# Patient Record
Sex: Male | Born: 1945 | Race: White | Hispanic: No | Marital: Married | Smoking: Former smoker
Health system: Southern US, Community
[De-identification: ages and names within clinical notes are randomized; demographics above are authoritative.]

## PROBLEM LIST (undated history)

## (undated) DIAGNOSIS — E785 Hyperlipidemia, unspecified: Secondary | ICD-10-CM

## (undated) DIAGNOSIS — H269 Unspecified cataract: Secondary | ICD-10-CM

## (undated) DIAGNOSIS — F1021 Alcohol dependence, in remission: Secondary | ICD-10-CM

## (undated) DIAGNOSIS — F419 Anxiety disorder, unspecified: Secondary | ICD-10-CM

## (undated) DIAGNOSIS — M1990 Unspecified osteoarthritis, unspecified site: Secondary | ICD-10-CM

## (undated) DIAGNOSIS — N400 Enlarged prostate without lower urinary tract symptoms: Secondary | ICD-10-CM

## (undated) DIAGNOSIS — M109 Gout, unspecified: Secondary | ICD-10-CM

## (undated) DIAGNOSIS — I10 Essential (primary) hypertension: Secondary | ICD-10-CM

## (undated) HISTORY — DX: Gout, unspecified: M10.9

## (undated) HISTORY — DX: Essential (primary) hypertension: I10

## (undated) HISTORY — DX: Alcohol dependence, in remission: F10.21

## (undated) HISTORY — PX: SHOULDER SURGERY: SHX246

## (undated) HISTORY — DX: Benign prostatic hyperplasia without lower urinary tract symptoms: N40.0

## (undated) HISTORY — DX: Unspecified osteoarthritis, unspecified site: M19.90

## (undated) HISTORY — PX: HERNIA REPAIR: SHX51

## (undated) HISTORY — PX: ELBOW SURGERY: SHX618

## (undated) HISTORY — DX: Anxiety disorder, unspecified: F41.9

## (undated) HISTORY — DX: Unspecified cataract: H26.9

## (undated) HISTORY — PX: KNEE SURGERY: SHX244

## (undated) HISTORY — DX: Hyperlipidemia, unspecified: E78.5

---

## 1978-07-21 HISTORY — PX: VASECTOMY: SHX75

## 1998-06-02 ENCOUNTER — Emergency Department (HOSPITAL_COMMUNITY): Admission: EM | Admit: 1998-06-02 | Discharge: 1998-06-02 | Payer: Self-pay | Admitting: *Deleted

## 1998-06-02 ENCOUNTER — Encounter: Payer: Self-pay | Admitting: Emergency Medicine

## 1998-08-01 ENCOUNTER — Other Ambulatory Visit: Admission: RE | Admit: 1998-08-01 | Discharge: 1998-08-01 | Payer: Self-pay | Admitting: Gastroenterology

## 1998-08-24 ENCOUNTER — Ambulatory Visit (HOSPITAL_COMMUNITY): Admission: RE | Admit: 1998-08-24 | Discharge: 1998-08-24 | Payer: Self-pay | Admitting: Gastroenterology

## 2000-03-14 ENCOUNTER — Emergency Department (HOSPITAL_COMMUNITY): Admission: EM | Admit: 2000-03-14 | Discharge: 2000-03-14 | Payer: Self-pay | Admitting: Emergency Medicine

## 2001-08-20 ENCOUNTER — Ambulatory Visit (HOSPITAL_COMMUNITY): Admission: RE | Admit: 2001-08-20 | Discharge: 2001-08-20 | Payer: Self-pay | Admitting: Gastroenterology

## 2001-12-19 ENCOUNTER — Emergency Department (HOSPITAL_COMMUNITY): Admission: EM | Admit: 2001-12-19 | Discharge: 2001-12-19 | Payer: Self-pay

## 2001-12-19 ENCOUNTER — Encounter: Payer: Self-pay | Admitting: Emergency Medicine

## 2005-02-08 ENCOUNTER — Emergency Department (HOSPITAL_COMMUNITY): Admission: EM | Admit: 2005-02-08 | Discharge: 2005-02-08 | Payer: Self-pay | Admitting: Emergency Medicine

## 2005-02-23 ENCOUNTER — Emergency Department (HOSPITAL_COMMUNITY): Admission: EM | Admit: 2005-02-23 | Discharge: 2005-02-23 | Payer: Self-pay | Admitting: Emergency Medicine

## 2006-08-13 ENCOUNTER — Ambulatory Visit (HOSPITAL_BASED_OUTPATIENT_CLINIC_OR_DEPARTMENT_OTHER): Admission: RE | Admit: 2006-08-13 | Discharge: 2006-08-14 | Payer: Self-pay | Admitting: Orthopedic Surgery

## 2011-07-07 ENCOUNTER — Ambulatory Visit (INDEPENDENT_AMBULATORY_CARE_PROVIDER_SITE_OTHER): Payer: BC Managed Care – PPO | Admitting: Family Medicine

## 2011-07-07 DIAGNOSIS — R252 Cramp and spasm: Secondary | ICD-10-CM

## 2011-07-09 ENCOUNTER — Other Ambulatory Visit: Payer: Self-pay | Admitting: Family Medicine

## 2011-07-09 DIAGNOSIS — M5416 Radiculopathy, lumbar region: Secondary | ICD-10-CM

## 2011-07-18 ENCOUNTER — Other Ambulatory Visit: Payer: Self-pay

## 2011-07-22 HISTORY — PX: EYE SURGERY: SHX253

## 2011-07-24 ENCOUNTER — Ambulatory Visit (INDEPENDENT_AMBULATORY_CARE_PROVIDER_SITE_OTHER): Payer: BC Managed Care – PPO

## 2011-07-24 DIAGNOSIS — N390 Urinary tract infection, site not specified: Secondary | ICD-10-CM

## 2011-07-24 DIAGNOSIS — R509 Fever, unspecified: Secondary | ICD-10-CM

## 2011-07-24 DIAGNOSIS — D7289 Other specified disorders of white blood cells: Secondary | ICD-10-CM

## 2011-08-02 ENCOUNTER — Other Ambulatory Visit (INDEPENDENT_AMBULATORY_CARE_PROVIDER_SITE_OTHER): Payer: BC Managed Care – PPO

## 2011-08-02 DIAGNOSIS — N390 Urinary tract infection, site not specified: Secondary | ICD-10-CM

## 2011-11-09 ENCOUNTER — Ambulatory Visit (INDEPENDENT_AMBULATORY_CARE_PROVIDER_SITE_OTHER): Payer: BC Managed Care – PPO | Admitting: Family Medicine

## 2011-11-09 VITALS — BP 113/71 | HR 108 | Temp 98.0°F | Resp 18 | Ht 69.5 in | Wt 217.8 lb

## 2011-11-09 DIAGNOSIS — R300 Dysuria: Secondary | ICD-10-CM

## 2011-11-09 DIAGNOSIS — N400 Enlarged prostate without lower urinary tract symptoms: Secondary | ICD-10-CM

## 2011-11-09 LAB — POCT URINALYSIS DIPSTICK
Bilirubin, UA: NEGATIVE
Glucose, UA: NEGATIVE
Ketones, UA: NEGATIVE
Leukocytes, UA: NEGATIVE

## 2011-11-09 LAB — POCT CBC
Granulocyte percent: 76.8 %G (ref 37–80)
HCT, POC: 41.8 % — AB (ref 43.5–53.7)
Hemoglobin: 13.8 g/dL — AB (ref 14.1–18.1)
Lymph, poc: 2.5 (ref 0.6–3.4)
POC Granulocyte: 10.9 — AB (ref 2–6.9)

## 2011-11-09 LAB — POCT UA - MICROSCOPIC ONLY
Bacteria, U Microscopic: NEGATIVE
Mucus, UA: NEGATIVE
WBC, Ur, HPF, POC: NEGATIVE

## 2011-11-09 MED ORDER — LEVOFLOXACIN 500 MG PO TABS: 500.0000 mg | ORAL_TABLET | Freq: Every day | ORAL | Status: AC

## 2011-11-09 MED ORDER — DOXAZOSIN MESYLATE 1 MG PO TABS: 1.0000 mg | ORAL_TABLET | Freq: Every day | ORAL | Status: DC

## 2011-11-09 NOTE — Progress Notes (Signed)
  Subjective:    Patient ID: Clayton Lawson, male    DOB: 1946/01/03, 66 y.o.   MRN: [id number]  HPI Patient presents complaining of chills, tactile temperature, headache, dysuria and malaise. Urinary frequency; weak urine stream Was on at different times Cardura and Flomax No sick contacts(grandchildren now living with him)  Seasonal allergies  Recent cataract surgery(3/13, 4/3) Nonsmoker Daughter and her children have moved in with patient. Significant stressors.  Review of Systems     Objective:   Physical Exam  Constitutional: He appears well-developed.  Neck: Neck supple.  Cardiovascular: Normal rate, regular rhythm and normal heart sounds.   Pulmonary/Chest: Effort normal and breath sounds normal.  Abdominal: Soft.  Musculoskeletal: Normal range of motion.  Neurological: He is alert.  Skin: Skin is warm.          Results for orders placed in visit on 11/09/11  POCT CBC      Component Value Range   WBC 14.2 (*) 4.6 - 10.2 (K/uL)   Lymph, poc 2.5  0.6 - 3.4    POC LYMPH PERCENT 17.3  10 - 50 (%L)   MID (cbc) 0.8  0 - 0.9    POC MID % 5.9  0 - 12 (%M)   POC Granulocyte 10.9 (*) 2 - 6.9    Granulocyte percent 76.8  37 - 80 (%G)   RBC 4.75  4.69 - 6.13 (M/uL)   Hemoglobin 13.8 (*) 14.1 - 18.1 (g/dL)   HCT, POC 78.2 (*) 95.6 - 53.7 (%)   MCV 87.9  80 - 97 (fL)   MCH, POC 29.1  27 - 31.2 (pg)   MCHC 33.0  31.8 - 35.4 (g/dL)   RDW, POC 21.3     Platelet Count, POC 316  142 - 424 (K/uL)   MPV 7.9  0 - 99.8 (fL)  POCT URINALYSIS DIPSTICK      Component Value Range   Color, UA yellow     Clarity, UA clear     Glucose, UA neg     Bilirubin, UA neg     Ketones, UA neg     Spec Grav, UA 1.010     Blood, UA neg     pH, UA 5.5     Protein, UA neg     Urobilinogen, UA 0.2     Nitrite, UA neg     Leukocytes, UA Negative    POCT UA - MICROSCOPIC ONLY      Component Value Range   WBC, Ur, HPF, POC neg     RBC, urine, microscopic neg     Bacteria, U Microscopic  neg     Mucus, UA neg     Epithelial cells, urine per micros neg     Crystals, Ur, HPF, POC neg     Casts, Ur, LPF, POC neg     Yeast, UA neg      Assessment & Plan:   1. Dysuria; early prostatitis  POCT CBC, POCT urinalysis dipstick, POCT UA - Microscopic Only, levofloxacin (LEVAQUIN) 500 MG tablet  2. BPH (benign prostatic hyperplasia)  doxazosin (CARDURA) 1 MG tablet    1. Dysuria; early prostatitis  POCT CBC, POCT urinalysis dipstick, POCT UA - Microscopic Only, levofloxacin (LEVAQUIN) 500 MG tablet  2. BPH (benign prostatic hyperplasia)  doxazosin (CARDURA) 1 MG tablet

## 2011-12-15 ENCOUNTER — Other Ambulatory Visit: Payer: Self-pay | Admitting: Family Medicine

## 2012-01-30 ENCOUNTER — Other Ambulatory Visit: Payer: Self-pay | Admitting: Physician Assistant

## 2012-01-30 ENCOUNTER — Other Ambulatory Visit: Payer: Self-pay | Admitting: *Deleted

## 2012-01-30 MED ORDER — PRAVASTATIN SODIUM 40 MG PO TABS: 40.0000 mg | ORAL_TABLET | Freq: Every day | ORAL | Status: DC

## 2012-02-02 ENCOUNTER — Other Ambulatory Visit: Payer: Self-pay | Admitting: Family Medicine

## 2012-02-06 ENCOUNTER — Other Ambulatory Visit: Payer: Self-pay

## 2012-02-06 MED ORDER — COLCHICINE 0.6 MG PO TABS: 0.6000 mg | ORAL_TABLET | Freq: Every day | ORAL | Status: DC

## 2012-05-28 ENCOUNTER — Ambulatory Visit (INDEPENDENT_AMBULATORY_CARE_PROVIDER_SITE_OTHER): Payer: Medicare Other | Admitting: Family Medicine

## 2012-05-28 ENCOUNTER — Encounter: Payer: Self-pay | Admitting: Family Medicine

## 2012-05-28 VITALS — BP 120/80 | HR 58 | Temp 97.9°F | Resp 16 | Ht 69.0 in | Wt 217.6 lb

## 2012-05-28 DIAGNOSIS — M109 Gout, unspecified: Secondary | ICD-10-CM | POA: Insufficient documentation

## 2012-05-28 DIAGNOSIS — E669 Obesity, unspecified: Secondary | ICD-10-CM

## 2012-05-28 DIAGNOSIS — N138 Other obstructive and reflux uropathy: Secondary | ICD-10-CM

## 2012-05-28 DIAGNOSIS — Z125 Encounter for screening for malignant neoplasm of prostate: Secondary | ICD-10-CM

## 2012-05-28 DIAGNOSIS — Z23 Encounter for immunization: Secondary | ICD-10-CM

## 2012-05-28 DIAGNOSIS — I10 Essential (primary) hypertension: Secondary | ICD-10-CM | POA: Insufficient documentation

## 2012-05-28 DIAGNOSIS — N400 Enlarged prostate without lower urinary tract symptoms: Secondary | ICD-10-CM

## 2012-05-28 DIAGNOSIS — Z0000 Encounter for general adult medical examination without abnormal findings: Secondary | ICD-10-CM

## 2012-05-28 DIAGNOSIS — E785 Hyperlipidemia, unspecified: Secondary | ICD-10-CM | POA: Insufficient documentation

## 2012-05-28 LAB — POCT URINALYSIS DIPSTICK
Blood, UA: NEGATIVE
Leukocytes, UA: NEGATIVE
Nitrite, UA: NEGATIVE
Protein, UA: NEGATIVE
Urobilinogen, UA: 0.2
pH, UA: 7

## 2012-05-28 LAB — CBC WITH DIFFERENTIAL/PLATELET
Basophils Absolute: 0 10*3/uL (ref 0.0–0.1)
Basophils Relative: 1 % (ref 0–1)
Eosinophils Absolute: 0.4 10*3/uL (ref 0.0–0.7)
Eosinophils Relative: 5 % (ref 0–5)
MCH: 29.1 pg (ref 26.0–34.0)
MCV: 86.6 fL (ref 78.0–100.0)
Neutrophils Relative %: 62 % (ref 43–77)
Platelets: 293 10*3/uL (ref 150–400)
RBC: 4.85 MIL/uL (ref 4.22–5.81)
RDW: 13.9 % (ref 11.5–15.5)
WBC: 6.9 10*3/uL (ref 4.0–10.5)

## 2012-05-28 LAB — COMPREHENSIVE METABOLIC PANEL
ALT: 36 U/L (ref 0–53)
AST: 23 U/L (ref 0–37)
BUN: 17 mg/dL (ref 6–23)
Calcium: 9.3 mg/dL (ref 8.4–10.5)
Chloride: 104 mEq/L (ref 96–112)
Creat: 0.92 mg/dL (ref 0.50–1.35)
Total Bilirubin: 0.5 mg/dL (ref 0.3–1.2)

## 2012-05-28 LAB — LIPID PANEL
Cholesterol: 177 mg/dL (ref 0–200)
HDL: 36 mg/dL — ABNORMAL LOW (ref >39)
Total CHOL/HDL Ratio: 4.9 Ratio
VLDL: 29 mg/dL (ref 0–40)

## 2012-05-28 MED ORDER — TAMSULOSIN HCL 0.4 MG PO CAPS: 0.4000 mg | ORAL_CAPSULE | Freq: Every day | ORAL | Status: DC

## 2012-05-28 MED ORDER — ALLOPURINOL 300 MG PO TABS: 300.0000 mg | ORAL_TABLET | Freq: Every day | ORAL | Status: DC

## 2012-05-28 MED ORDER — DOXAZOSIN MESYLATE 1 MG PO TABS: 1.0000 mg | ORAL_TABLET | Freq: Every day | ORAL | Status: DC

## 2012-05-28 MED ORDER — PRAVASTATIN SODIUM 40 MG PO TABS: 40.0000 mg | ORAL_TABLET | Freq: Every day | ORAL | Status: DC

## 2012-05-28 MED ORDER — LISINOPRIL-HYDROCHLOROTHIAZIDE 20-25 MG PO TABS: 1.0000 | ORAL_TABLET | Freq: Every day | ORAL | Status: DC

## 2012-05-28 NOTE — Progress Notes (Signed)
Subjective:    Patient ID: Clayton Kin., male    DOB: [birth year], 66 y.o.   MRN: [id number]  HPI  This 66 y.o. Cauc male is here for annual CPE; chronic medications are taken for HTN, Gout  and occasional joint discomfort (not debilitating); he is s/p multiple joint surgeries.   He is married and works full time. Alcohol  consumption is daily; current  Nonsmoker.  Pt exercises most days of the week.   HCM: Colonoscopies at age 62 and 78.    Review of Systems  Constitutional: Negative.   HENT: Negative.   Eyes: Negative.   Respiratory: Negative.   Cardiovascular: Negative.   Gastrointestinal: Negative.   Genitourinary: Positive for frequency.  Musculoskeletal: Negative.   Skin: Negative.   Neurological: Negative.   Hematological: Negative.   Psychiatric/Behavioral: Negative.        Objective:   Physical Exam  Nursing note and vitals reviewed. Constitutional: He is oriented to person, place, and time. Vital signs are normal. He appears well-developed and well-nourished. No distress.  HENT:  Head: Normocephalic and atraumatic.  Right Ear: Hearing, tympanic membrane, external ear and ear canal normal.  Left Ear: Hearing, tympanic membrane, external ear and ear canal normal.  Nose: Nose normal. No mucosal edema, nasal deformity or septal deviation.  Mouth/Throat: Uvula is midline, oropharynx is clear and moist and mucous membranes are normal. No oral lesions. Normal dentition. No dental caries.  Eyes: Conjunctivae normal, EOM and lids are normal. Pupils are equal, round, and reactive to light. No scleral icterus.  Fundoscopic exam:      The right eye shows no papilledema. The right eye shows red reflex.      The left eye shows no papilledema. The left eye shows red reflex.      S/P Cataract surgery  Neck: Normal range of motion. Neck supple. No thyromegaly present.  Cardiovascular: Normal rate, regular rhythm, normal heart sounds and intact distal pulses.  Exam reveals  no gallop and no friction rub.   No murmur heard. Pulmonary/Chest: Effort normal and breath sounds normal. No respiratory distress.  Abdominal: Soft. Bowel sounds are normal. He exhibits no distension, no abdominal bruit, no pulsatile midline mass and no mass. There is no hepatosplenomegaly. There is no tenderness. There is no guarding and no CVA tenderness. No hernia. Hernia confirmed negative in the right inguinal area and confirmed negative in the left inguinal area.  Genitourinary: Rectum normal, testes normal and penis normal. Rectal exam shows no external hemorrhoid, no fissure, no mass, no tenderness and anal tone normal. Guaiac negative stool. Prostate is enlarged. Prostate is not tender. Cremasteric reflex is present. Right testis shows no mass, no swelling and no tenderness. Right testis is descended. Left testis shows no mass, no swelling and no tenderness. Left testis is descended.       Prostate minimally enlarged  Musculoskeletal: Normal range of motion. He exhibits no edema and no tenderness.  Lymphadenopathy:    He has no cervical adenopathy.       Right: No inguinal adenopathy present.       Left: No inguinal adenopathy present.  Neurological: He is alert and oriented to person, place, and time. He has normal reflexes. No cranial nerve deficit. He exhibits normal muscle tone. Coordination normal.  Skin: Skin is warm and dry. No rash noted. No erythema. No pallor.  Psychiatric: He has a normal mood and affect. His behavior is normal. Judgment and thought content normal.  ECG: NSR; no ST-TW changes, no ectopy.     Assessment & Plan:   1. Routine general medical examination at a health care facility   CBC with Differential, POCT urinalysis dipstick  2. HTN (hypertension)  EKG 12-Lead, Comprehensive metabolic panel, Lipid panel  3. Gouty arthropathy, unspecified  Uric acid  4. BPH (benign prostatic hyperplasia)  RX: Flomax (generic) 0.4.mg 1 tab daily  5. Need for  prophylactic vaccination and inoculation against influenza  Flu vaccine greater than or equal to 3yo preservative free IM  6. Special screening for malignant neoplasm of prostate  PSA, Medicare  7. Obesity (BMI 30.0-34.9)  Pt's goal= 200 lbs. Plans to modify nutrition.

## 2012-05-28 NOTE — Patient Instructions (Signed)
Keeping you healthy  Get these tests  Blood pressure- Have your blood pressure checked once a year by your healthcare provider.  Normal blood pressure is 120/80  Weight- Have your body mass index (BMI) calculated to screen for obesity.  BMI is a measure of body fat based on height and weight. You can also calculate your own BMI at [email or web address].  Cholesterol- Have your cholesterol checked every year.  Diabetes- Have your blood sugar checked regularly if you have high blood pressure, high cholesterol, have a family history of diabetes or if you are overweight.  Screening for Colon Cancer- Colonoscopy starting at age 8.  Screening may begin sooner depending on your family history and other health conditions. Follow up colonoscopy as directed by your Gastroenterologist.  Screening for Prostate Cancer- Both blood work (PSA) and a rectal exam help screen for Prostate Cancer.  Screening begins at age 20 with African-American men and at age 83 with Caucasian men.  Screening may begin sooner depending on your family history.  Take these medicines  Aspirin- One aspirin daily can help prevent Heart disease and Stroke.  Flu shot- Every fall.  Tetanus- Every 10 years.  Zostavax- Once after the age of 40 to prevent Shingles.  You have been given RX: Zostavax to be administered at [facility] or [facility].  Pneumonia shot- Once after the age of 90; if you are younger than 72, ask your healthcare provider if you need a Pneumonia shot.  Take these steps  Don't smoke- If you do smoke, talk to your doctor about quitting.  For tips on how to quit, go to www.smokefree.gov or call 1-800-QUIT-NOW.  Be physically active- Exercise 5 days a week for at least 30 minutes.  If you are not already physically active start slow and gradually work up to 30 minutes of moderate physical activity.  Examples of moderate activity include walking briskly, mowing the yard, dancing, swimming, bicycling,  etc.  Eat a healthy diet- Eat a variety of healthy food such as fruits, vegetables, low fat milk, low fat cheese, yogurt, lean meant, poultry, fish, beans, tofu, etc. For more information go to www.thenutritionsource.org  Drink alcohol in moderation- Limit alcohol intake to less than two drinks a day. Never drink and drive.  Dentist- Brush and floss twice daily; visit your dentist twice a year.  Depression- Your emotional health is as important as your physical health. If you're feeling down, or losing interest in things you would normally enjoy please talk to your healthcare provider.  Eye exam- Visit your eye doctor every year.  Safe sex- If you may be exposed to a sexually transmitted infection, use a condom.  Seat belts- Seat belts can save your life; always wear one.  Smoke/Carbon Monoxide detectors- These detectors need to be installed on the appropriate level of your home.  Replace batteries at least once a year.  Skin cancer- When out in the sun, cover up and use sunscreen 15 SPF or higher.  Violence- If anyone is threatening you, please tell your healthcare provider.  Living Will/ Health care power of attorney- Speak with your healthcare provider and family.

## 2012-05-29 LAB — PSA, MEDICARE: PSA: 0.51 ng/mL (ref <=4.00)

## 2012-06-02 ENCOUNTER — Encounter: Payer: Self-pay | Admitting: Family Medicine

## 2012-08-18 ENCOUNTER — Ambulatory Visit: Payer: Medicare Other

## 2012-08-18 ENCOUNTER — Ambulatory Visit (INDEPENDENT_AMBULATORY_CARE_PROVIDER_SITE_OTHER): Payer: Medicare Other | Admitting: Emergency Medicine

## 2012-08-18 VITALS — BP 147/84 | HR 69 | Temp 98.0°F | Resp 16 | Ht 70.0 in | Wt 227.8 lb

## 2012-08-18 DIAGNOSIS — J3489 Other specified disorders of nose and nasal sinuses: Secondary | ICD-10-CM

## 2012-08-18 DIAGNOSIS — J329 Chronic sinusitis, unspecified: Secondary | ICD-10-CM

## 2012-08-18 MED ORDER — AMOXICILLIN-POT CLAVULANATE 875-125 MG PO TABS: 1.0000 | ORAL_TABLET | Freq: Two times a day (BID) | ORAL | Status: DC

## 2012-08-18 NOTE — Progress Notes (Signed)
  Subjective:    Patient ID: Clayton Kin., male    DOB: 1946-04-10, 67 y.o.   MRN: [id number]  HPI  Patient presents with three day history of right sided sinus/facial pain. Right side nares is swollen and feels painful, states he has redness inside his nose. No other complaints, denies cough, fever, or any respiratory complaints. States he has history of Staph infections in the past, but no MRSA. Has retired, worked in [employer], states has had recurrent sinus infections when he was working.  Review of Systems     Objective:   Physical Exam HEENT exam. TMs are clear. The left nares is normal. There's purulence in the right anterior nares. There is some mild puffiness in this area. The throat is clear. Chest is clear to both auscultation and percussion.  [facility] reading (PRIMARY) by  Dr. Cleta Alberts there may be some mild thickening of the maxillary sinus cavity on the right there is no air-fluid level.        Assessment & Plan:  [place] sinuses.  Culture nasal right nares I am concerned this may be MRSA  . A culture was done for MRSA. We'll go ahead and treat is a sinusitis for now with Augmentin 875 twice a day. We can change antibiotics if his culture grows MRSA .

## 2012-08-18 NOTE — Patient Instructions (Addendum)
Use motrin for discomfort. Take Mucinex twice daily for sinus congestion. Take all of the antibiotics. If not better within a few days let us know. Hot showers, saline nasal spray are helpful also. We will call you with a report of the culture in 2-3 days.      Sinusitis Sinusitis is redness, soreness, and swelling (inflammation) of the paranasal sinuses. Paranasal sinuses are air pockets within the bones of your face (beneath the eyes, the middle of the forehead, or above the eyes). In healthy paranasal sinuses, mucus is able to drain out, and air is able to circulate through them by way of your nose. However, when your paranasal sinuses are inflamed, mucus and air can become trapped. This can allow bacteria and other germs to grow and cause infection. Sinusitis can develop quickly and last only a short time (acute) or continue over a long period (chronic). Sinusitis that lasts for more than 12 weeks is considered chronic.  CAUSES  Causes of sinusitis include:  Allergies.  Structural abnormalities, such as displacement of the cartilage that separates your nostrils (deviated septum), which can decrease the air flow through your nose and sinuses and affect sinus drainage.  Functional abnormalities, such as when the small hairs (cilia) that line your sinuses and help remove mucus do not work properly or are not present. SYMPTOMS  Symptoms of acute and chronic sinusitis are the same. The primary symptoms are pain and pressure around the affected sinuses. Other symptoms include:  Upper toothache.  Earache.  Headache.  Bad breath.  Decreased sense of smell and taste.  A cough, which worsens when you are lying flat.  Fatigue.  Fever.  Thick drainage from your nose, which often is green and may contain pus (purulent).  Swelling and warmth over the affected sinuses. DIAGNOSIS  Your caregiver will perform a physical exam. During the exam, your caregiver may:  Look in your nose for  signs of abnormal growths in your nostrils (nasal polyps).  Tap over the affected sinus to check for signs of infection.  View the inside of your sinuses (endoscopy) with a special imaging device with a light attached (endoscope), which is inserted into your sinuses. If your caregiver suspects that you have chronic sinusitis, one or more of the following tests may be recommended:  Allergy tests.  Nasal culture A sample of mucus is taken from your nose and sent to a lab and screened for bacteria.  Nasal cytology A sample of mucus is taken from your nose and examined by your caregiver to determine if your sinusitis is related to an allergy. TREATMENT  Most cases of acute sinusitis are related to a viral infection and will resolve on their own within 10 days. Sometimes medicines are prescribed to help relieve symptoms (pain medicine, decongestants, nasal steroid sprays, or saline sprays).  However, for sinusitis related to a bacterial infection, your caregiver will prescribe antibiotic medicines. These are medicines that will help kill the bacteria causing the infection.  Rarely, sinusitis is caused by a fungal infection. In theses cases, your caregiver will prescribe antifungal medicine. For some cases of chronic sinusitis, surgery is needed. Generally, these are cases in which sinusitis recurs more than 3 times per year, despite other treatments. HOME CARE INSTRUCTIONS   Drink plenty of water. Water helps thin the mucus so your sinuses can drain more easily.  Use a humidifier.  Inhale steam 3 to 4 times a day (for example, sit in the bathroom with the shower running).  Apply a warm, moist washcloth to your face 3 to 4 times a day, or as directed by your caregiver.  Use saline nasal sprays to help moisten and clean your sinuses.  Take over-the-counter or prescription medicines for pain, discomfort, or fever only as directed by your caregiver. SEEK IMMEDIATE MEDICAL CARE IF:  You have  increasing pain or severe headaches.  You have nausea, vomiting, or drowsiness.  You have swelling around your face.  You have vision problems.  You have a stiff neck.  You have difficulty breathing. MAKE SURE YOU:   Understand these instructions.  Will watch your condition.  Will get help right away if you are not doing well or get worse. Document Released: 07/07/2005 Document Revised: 09/29/2011 Document Reviewed: 07/22/2011 [facility] Patient Information 2013 [place],

## 2012-08-21 LAB — WOUND CULTURE

## 2012-12-16 ENCOUNTER — Other Ambulatory Visit: Payer: Self-pay | Admitting: Family Medicine

## 2013-02-03 ENCOUNTER — Telehealth: Payer: Self-pay

## 2013-02-03 MED ORDER — LISINOPRIL-HYDROCHLOROTHIAZIDE 20-25 MG PO TABS: 1.0000 | ORAL_TABLET | Freq: Every day | ORAL | Status: DC

## 2013-02-03 MED ORDER — ALLOPURINOL 300 MG PO TABS: 300.0000 mg | ORAL_TABLET | Freq: Every day | ORAL | Status: DC

## 2013-02-03 NOTE — Telephone Encounter (Signed)
Patient does not appear to be taking as directed, last sent in his BP meds in May, please advise pended

## 2013-02-03 NOTE — Telephone Encounter (Signed)
Pt has made a follow-up visit with dr Audria Nine on 8/12 but needs a refill of his gout and blood pressure medications until then.  [facility] [address]. His phone # [phone] Ok to speak with wife or leave a message

## 2013-02-03 NOTE — Telephone Encounter (Signed)
Sent.  Follow up with Dr. Audria Nine as planned

## 2013-02-05 ENCOUNTER — Other Ambulatory Visit: Payer: Self-pay | Admitting: Family Medicine

## 2013-03-01 ENCOUNTER — Ambulatory Visit: Payer: Medicare Other | Admitting: Family Medicine

## 2013-03-04 ENCOUNTER — Other Ambulatory Visit: Payer: Self-pay | Admitting: Physician Assistant

## 2013-03-08 ENCOUNTER — Encounter: Payer: Self-pay | Admitting: Family Medicine

## 2013-04-19 ENCOUNTER — Other Ambulatory Visit: Payer: Self-pay | Admitting: Physician Assistant

## 2013-05-12 ENCOUNTER — Encounter: Payer: Self-pay | Admitting: Family Medicine

## 2013-05-12 ENCOUNTER — Ambulatory Visit (INDEPENDENT_AMBULATORY_CARE_PROVIDER_SITE_OTHER): Payer: Medicare Other | Admitting: Family Medicine

## 2013-05-12 VITALS — BP 130/84 | HR 89 | Temp 98.2°F | Resp 16 | Ht 70.0 in | Wt 221.0 lb

## 2013-05-12 DIAGNOSIS — Z0000 Encounter for general adult medical examination without abnormal findings: Secondary | ICD-10-CM

## 2013-05-12 DIAGNOSIS — Z1159 Encounter for screening for other viral diseases: Secondary | ICD-10-CM

## 2013-05-12 DIAGNOSIS — M109 Gout, unspecified: Secondary | ICD-10-CM

## 2013-05-12 DIAGNOSIS — E785 Hyperlipidemia, unspecified: Secondary | ICD-10-CM

## 2013-05-12 DIAGNOSIS — N400 Enlarged prostate without lower urinary tract symptoms: Secondary | ICD-10-CM

## 2013-05-12 DIAGNOSIS — Z23 Encounter for immunization: Secondary | ICD-10-CM

## 2013-05-12 DIAGNOSIS — I10 Essential (primary) hypertension: Secondary | ICD-10-CM

## 2013-05-12 DIAGNOSIS — Z139 Encounter for screening, unspecified: Secondary | ICD-10-CM

## 2013-05-12 LAB — POCT URINALYSIS DIPSTICK
Bilirubin, UA: NEGATIVE
Leukocytes, UA: NEGATIVE
Nitrite, UA: NEGATIVE
Protein, UA: NEGATIVE
Urobilinogen, UA: 0.2
pH, UA: 5.5

## 2013-05-12 LAB — LIPID PANEL
HDL: 42 mg/dL (ref >39)
LDL Cholesterol: 94 mg/dL (ref 0–99)

## 2013-05-12 LAB — URIC ACID: Uric Acid, Serum: 6 mg/dL (ref 4.0–7.8)

## 2013-05-12 LAB — IFOBT (OCCULT BLOOD): IFOBT: POSITIVE

## 2013-05-12 LAB — COMPREHENSIVE METABOLIC PANEL
ALT: 24 U/L (ref 0–53)
AST: 20 U/L (ref 0–37)
Albumin: 4.3 g/dL (ref 3.5–5.2)
Alkaline Phosphatase: 58 U/L (ref 39–117)
Calcium: 9.3 mg/dL (ref 8.4–10.5)
Glucose, Bld: 96 mg/dL (ref 70–99)
Potassium: 4.3 mEq/L (ref 3.5–5.3)
Sodium: 140 mEq/L (ref 135–145)
Total Protein: 6.7 g/dL (ref 6.0–8.3)

## 2013-05-12 MED ORDER — TAMSULOSIN HCL 0.4 MG PO CAPS: 0.8000 mg | ORAL_CAPSULE | Freq: Every day | ORAL | Status: DC

## 2013-05-12 MED ORDER — ALLOPURINOL 300 MG PO TABS: ORAL_TABLET | ORAL | Status: DC

## 2013-05-12 MED ORDER — LISINOPRIL-HYDROCHLOROTHIAZIDE 20-25 MG PO TABS: ORAL_TABLET | ORAL | Status: DC

## 2013-05-12 MED ORDER — CELECOXIB 200 MG PO CAPS: 200.0000 mg | ORAL_CAPSULE | Freq: Two times a day (BID) | ORAL | Status: DC

## 2013-05-12 MED ORDER — PRAVASTATIN SODIUM 40 MG PO TABS: 40.0000 mg | ORAL_TABLET | Freq: Every day | ORAL | Status: DC

## 2013-05-12 MED ORDER — ZOSTER VACCINE LIVE 19400 UNT/0.65ML ~~LOC~~ SOLR: 0.6500 mL | Freq: Once | SUBCUTANEOUS | Status: DC

## 2013-05-12 NOTE — Progress Notes (Signed)
Subjective:    Patient ID: Clayton Lawson., male    DOB: Mar 13, 1946, 67 y.o.   MRN: [id number]  HPI  This 67 y.o. Cau male is here for CPE. HTN is well controlled on current medication w/o adverse effects. He exercises 1-2 times /week. No recent gout flare on daily Allopurinol. 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 Pt took Vioxx years ago and this was very effective. Would like to use Celebrex but this is costly. Concerned today about worsening BPH symptoms; wants to increase Flomax dose.  HCM: Colonoscopy- due last year; pt will contact GI specialist.            IMM- Needs RX for Zostavax.             Patient Active Problem List   Diagnosis Date Noted  . HTN (hypertension) 05/28/2012  . Gouty arthropathy, unspecified 05/28/2012  . BPH (benign prostatic hyperplasia) 05/28/2012  . Obesity (BMI 30.0-34.9) 05/28/2012  . Dyslipidemia 05/28/2012    Review of Systems  Constitutional: Negative.   HENT: Positive for hearing loss.        Difficulty hearing conversaiton in noisy places; chronic noise exposure in work places.  Eyes:       Periodic vision eval w/ eye care specialist.  Genitourinary: Positive for dysuria, urgency and frequency.  Musculoskeletal: Positive for arthralgias and back pain.  Neurological: Negative.   Hematological: Negative.   Psychiatric/Behavioral: Negative.   All other systems reviewed and are negative.      Objective:   Physical Exam  Nursing note and vitals reviewed. Constitutional: He is oriented to person, place, and time. Vital signs are normal. He appears well-developed and well-nourished. No distress.  HENT:  Head: Normocephalic and atraumatic.  Right Ear: Hearing, tympanic membrane, external ear and ear canal normal.  Left Ear: Hearing, tympanic membrane, external ear and ear canal normal.  Nose: Nose normal. No nasal deformity or septal  deviation.  Mouth/Throat: Uvula is midline, oropharynx is clear and moist and mucous membranes are normal. No oral lesions. Normal dentition.  Eyes: Conjunctivae, EOM and lids are normal. Pupils are equal, round, and reactive to light. No scleral icterus.  Fundoscopic exam:      The right eye shows no papilledema. The right eye shows red reflex.       The left eye shows no papilledema. The left eye shows red reflex.  Neck: Normal range of motion and full passive range of motion without pain. Neck supple. No JVD present. No spinous process tenderness and no muscular tenderness present. Carotid bruit is not present. No mass and no thyromegaly present.  Cardiovascular: Normal rate, regular rhythm, S1 normal, S2 normal, normal heart sounds and normal pulses.   No extrasystoles are present. PMI is not displaced.  Exam reveals no gallop and no friction rub.   No murmur heard. Pulmonary/Chest: Effort normal and breath sounds normal. No respiratory distress.  Abdominal: Soft. Normal appearance and bowel sounds are normal. He exhibits no distension, no abdominal bruit, no pulsatile midline mass and no mass. There is no hepatosplenomegaly. There is no tenderness. There is no guarding and no CVA tenderness. A hernia is present.  Small peri-umbilical hernia (about size of a quarter).  Genitourinary: Rectal exam shows external hemorrhoid. Rectal exam shows no fissure, no mass, no tenderness and anal tone normal. Prostate is enlarged. Prostate is not tender.  Prostate-  R lobe of gland enlarged w/o discrete nodules.  Musculoskeletal:       Right wrist: Normal.       Right ankle: He exhibits decreased range of motion. He exhibits no ecchymosis and no deformity. No tenderness.       Left ankle: Normal.       Right forearm: Normal.       Left forearm: Normal.       Right hand: Normal.       Left hand: He exhibits tenderness and swelling. He exhibits normal range of motion and no deformity. Normal strength noted.  He exhibits no thumb/finger opposition and no wrist extension trouble.       Left foot: He exhibits tenderness.       Feet:  Lymphadenopathy:       Head (right side): No submental, no submandibular, no tonsillar, no posterior auricular and no occipital adenopathy present.       Head (left side): No submental, no submandibular, no tonsillar, no posterior auricular and no occipital adenopathy present.    He has no cervical adenopathy.    He has no axillary adenopathy.       Right: No inguinal and no supraclavicular adenopathy present.       Left: No inguinal and no supraclavicular adenopathy present.  Neurological: He is alert and oriented to person, place, and time. He has normal strength. He displays no atrophy. No cranial nerve deficit or sensory deficit. He exhibits normal muscle tone. He displays a negative Romberg sign. Coordination and gait normal.  Reflex Scores:      Tricep reflexes are 1+ on the right side and 1+ on the left side.      Bicep reflexes are 1+ on the right side and 1+ on the left side.      Patellar reflexes are 1+ on the right side and 1+ on the left side. Skin: Skin is warm, dry and intact. No ecchymosis, no lesion and no rash noted. He is not diaphoretic. No cyanosis or erythema. No pallor. Nails show no clubbing.  Superficial varicosities noted in lower extremities.  Psychiatric: He has a normal mood and affect. His speech is normal and behavior is normal. Judgment and thought content normal. Cognition and memory are normal.    Results for orders placed in visit on 05/12/13  POCT URINALYSIS DIPSTICK      Result Value Range   Color, UA yellow     Clarity, UA clear     Glucose, UA neg     Bilirubin, UA neg     Ketones, UA neg     Spec Grav, UA 1.020     Blood, UA neg     pH, UA 5.5     Protein, UA neg     Urobilinogen, UA 0.2     Nitrite, UA neg     Leukocytes, UA Negative    IFOBT (OCCULT BLOOD)      Result Value Range   IFOBT Positive          Assessment & Plan:  Routine general medical examination at a health care facility - Plan: POCT urinalysis dipstick, PSA, Medicare, zoster vaccine live, PF, (ZOSTAVAX) 96045 UNT/0.65ML injection, IFOBT POC (occult bld, rslt in office)  HTN (hypertension) - Stable; continue current medication and weight loss. Plan: Comprehensive metabolic panel  BPH (benign prostatic hyperplasia) - Increase Flomax to 0.8 mg (2- 0.4 mg caps once daily). Plan: PSA, Medicare  Gouty arthropathy, unspecified - Continue Allopurinol  Plan: Uric acid  Dyslipidemia - Continue Pravastatin 40 mg 1 tab hs Plan: Comprehensive metabolic panel, Lipid panel  Need for influenza vaccination - Plan: Flu Vaccine QUAD 36+ mos IM  Need for hepatitis C screening test - Plan: Hepatitis C antibody   Meds ordered this encounter  Medications  . Multiple Vitamin (MULTIVITAMIN) tablet    Sig: Take 1 tablet by mouth daily.  . celecoxib (CELEBREX) 200 MG capsule    Sig: Take 1 capsule (200 mg total) by mouth 2 (two) times daily.    Dispense:  33 capsule    Refill:  5  . zoster vaccine live, PF, (ZOSTAVAX) 09811 UNT/0.65ML injection    Sig: Inject 19,400 Units into the skin once.    Dispense:  1 each    Refill:  0  . allopurinol (ZYLOPRIM) 300 MG tablet    Sig: TAKE 1 TABLET EVERY DAY .    Dispense:  90 tablet    Refill:  3  . lisinopril-hydrochlorothiazide (PRINZIDE,ZESTORETIC) 20-25 MG per tablet    Sig: TAKE 1 TABLET BY MOUTH DAILY.    Dispense:  90 tablet    Refill:  3  . pravastatin (PRAVACHOL) 40 MG tablet    Sig: Take 1 tablet (40 mg total) by mouth daily.    Dispense:  90 tablet    Refill:  3  . tamsulosin (FLOMAX) 0.4 MG CAPS capsule    Sig: Take 2 capsules (0.8 mg total) by mouth daily. Take after same meal daily.    Dispense:  60 capsule    Refill:  11

## 2013-05-12 NOTE — Patient Instructions (Signed)
Keeping you healthy  Get these tests  Blood pressure- Have your blood pressure checked once a year by your healthcare provider.  Normal blood pressure is 120/80  Weight- Have your body mass index (BMI) calculated to screen for obesity.  BMI is a measure of body fat based on height and weight. You can also calculate your own BMI at [email or web address].  Cholesterol- Have your cholesterol checked every year.  Diabetes- Have your blood sugar checked regularly if you have high blood pressure, high cholesterol, have a family history of diabetes or if you are overweight.  Screening for Colon Cancer- Colonoscopy starting at age 33.  Screening may begin sooner depending on your family history and other health conditions. Follow up colonoscopy as directed by your Gastroenterologist. Call Dr. Kinnie Scales tomorrow or next week to get your colonoscopy scheduled. Your stool test today is positive.  Screening for Prostate Cancer- Both blood work (PSA) and a rectal exam help screen for Prostate Cancer.  Screening begins at age 33 with African-American men and at age 39 with Caucasian men.  Screening may begin sooner depending on your family history.  Take these medicines  Aspirin- One aspirin daily can help prevent Heart disease and Stroke.  Flu shot- Every fall.  Tetanus- Every 10 years.  Zostavax- Once after the age of 39 to prevent Shingles. You received prescription for this vaccine today.  Pneumonia shot- Once after the age of 55; if you are younger than 47, ask your healthcare provider if you need a Pneumonia shot.  Take these steps  Don't smoke- If you do smoke, talk to your doctor about quitting.  For tips on how to quit, go to www.smokefree.gov or call 1-800-QUIT-NOW.  Be physically active- Exercise 5 days a week for at least 30 minutes.  If you are not already physically active start slow and gradually work up to 30 minutes of moderate physical activity.  Examples of moderate activity  include walking briskly, mowing the yard, dancing, swimming, bicycling, etc.  Eat a healthy diet- Eat a variety of healthy food such as fruits, vegetables, low fat milk, low fat cheese, yogurt, lean meant, poultry, fish, beans, tofu, etc. For more information go to www.thenutritionsource.org  Drink alcohol in moderation- Limit alcohol intake to less than two drinks a day. Never drink and drive.  Dentist- Brush and floss twice daily; visit your dentist twice a year.  Depression- Your emotional health is as important as your physical health. If you're feeling down, or losing interest in things you would normally enjoy please talk to your healthcare provider.  Eye exam- Visit your eye doctor every year.  Safe sex- If you may be exposed to a sexually transmitted infection, use a condom.  Seat belts- Seat belts can save your life; always wear one.  Smoke/Carbon Monoxide detectors- These detectors need to be installed on the appropriate level of your home.  Replace batteries at least once a year.  Skin cancer- When out in the sun, cover up and use sunscreen 15 SPF or higher.  Violence- If anyone is threatening you, please tell your healthcare provider.  Living Will/ Health care power of attorney- Speak with your healthcare provider and family.

## 2013-05-13 LAB — PSA, MEDICARE: PSA: 0.77 ng/mL (ref <=4.00)

## 2013-05-17 NOTE — Progress Notes (Signed)
Quick Note:  Please notify pt that results are normal.   Provide pt with copy of labs. ______ 

## 2013-06-08 ENCOUNTER — Ambulatory Visit (INDEPENDENT_AMBULATORY_CARE_PROVIDER_SITE_OTHER): Payer: Medicare Other | Admitting: Family Medicine

## 2013-06-08 VITALS — BP 112/68 | HR 98 | Temp 98.0°F | Resp 18 | Ht 69.25 in | Wt 221.0 lb

## 2013-06-08 DIAGNOSIS — J0100 Acute maxillary sinusitis, unspecified: Secondary | ICD-10-CM

## 2013-06-08 NOTE — Progress Notes (Signed)
  Subjective:    Patient ID: Clayton Kin., male    DOB: 26-Jan-1946, 67 y.o.   MRN: [id number]  HPI Patient presents with right sinus pressure since Sunday. Really sore on right maxillary sinus and right nasal bridge. Has congestion and runny nose. Is green in color. No cough or chest congestion, ear pain, fever.  No dental pain. Has tried Mucinex liquid but this did not help and warm compresses and warm shower. Warm shower helps some too.  Review of Systems  Respiratory: Negative for cough and shortness of breath.   Cardiovascular: Negative for chest pain.  All other systems reviewed and are negative.      Objective:   Physical Exam  Constitutional: He is oriented to person, place, and time. He appears well-developed and well-nourished. No distress.  HENT:  Head: Normocephalic and atraumatic.  Right Ear: External ear normal.  Left Ear: External ear normal.  Mouth/Throat: Oropharynx is clear and moist. No oropharyngeal exudate.  Eyes: Conjunctivae are normal. Pupils are equal, round, and reactive to light. No scleral icterus.  Cardiovascular: Normal rate and regular rhythm.   No murmur heard. Pulmonary/Chest: Effort normal and breath sounds normal. No respiratory distress. He has no wheezes. He has no rales.  Musculoskeletal: Normal range of motion.  Lymphadenopathy:    He has no cervical adenopathy.  Neurological: He is alert and oriented to person, place, and time.  Skin: Skin is warm and dry. He is not diaphoretic.  Psychiatric: He has a normal mood and affect. His behavior is normal.  TMs normal bilaterally     Assessment & Plan:  #1. Acute sinusitis - Conservative care with Afrin, Mucinex D, Netti pot - Reassurance - Return if fever, worsening pain, systemic illness - F/u prn

## 2013-06-08 NOTE — Patient Instructions (Signed)
Thank you for coming in today   Continue warm compresses Try AFRIN nasal spray for up to 5 days Get [facility] D from behind the pharmacy counter Try Netti pot or saline sinus rinse If worsening or no better with fever, increased pain, etc please call and I will send in prescription for antibiotic next week.  Sinusitis Sinusitis is redness, soreness, and swelling (inflammation) of the paranasal sinuses. Paranasal sinuses are air pockets within the bones of your face (beneath the eyes, the middle of the forehead, or above the eyes). In healthy paranasal sinuses, mucus is able to drain out, and air is able to circulate through them by way of your nose. However, when your paranasal sinuses are inflamed, mucus and air can become trapped. This can allow bacteria and other germs to grow and cause infection. Sinusitis can develop quickly and last only a short time (acute) or continue over a long period (chronic). Sinusitis that lasts for more than 12 weeks is considered chronic.  CAUSES  Causes of sinusitis include:  Allergies.  Structural abnormalities, such as displacement of the cartilage that separates your nostrils (deviated septum), which can decrease the air flow through your nose and sinuses and affect sinus drainage.  Functional abnormalities, such as when the small hairs (cilia) that line your sinuses and help remove mucus do not work properly or are not present. SYMPTOMS  Symptoms of acute and chronic sinusitis are the same. The primary symptoms are pain and pressure around the affected sinuses. Other symptoms include:  Upper toothache.  Earache.  Headache.  Bad breath.  Decreased sense of smell and taste.  A cough, which worsens when you are lying flat.  Fatigue.  Fever.  Thick drainage from your nose, which often is green and may contain pus (purulent).  Swelling and warmth over the affected sinuses. DIAGNOSIS  Your caregiver will perform a physical exam. During the exam,  your caregiver may:  Look in your nose for signs of abnormal growths in your nostrils (nasal polyps).  Tap over the affected sinus to check for signs of infection.  View the inside of your sinuses (endoscopy) with a special imaging device with a light attached (endoscope), which is inserted into your sinuses. If your caregiver suspects that you have chronic sinusitis, one or more of the following tests may be recommended:  Allergy tests.  Nasal culture A sample of mucus is taken from your nose and sent to a lab and screened for bacteria.  Nasal cytology A sample of mucus is taken from your nose and examined by your caregiver to determine if your sinusitis is related to an allergy. TREATMENT  Most cases of acute sinusitis are related to a viral infection and will resolve on their own within 10 days. Sometimes medicines are prescribed to help relieve symptoms (pain medicine, decongestants, nasal steroid sprays, or saline sprays).  However, for sinusitis related to a bacterial infection, your caregiver will prescribe antibiotic medicines. These are medicines that will help kill the bacteria causing the infection.  Rarely, sinusitis is caused by a fungal infection. In theses cases, your caregiver will prescribe antifungal medicine. For some cases of chronic sinusitis, surgery is needed. Generally, these are cases in which sinusitis recurs more than 3 times per year, despite other treatments. HOME CARE INSTRUCTIONS   Drink plenty of water. Water helps thin the mucus so your sinuses can drain more easily.  Use a humidifier.  Inhale steam 3 to 4 times a day (for example, sit in the  bathroom with the shower running).  Apply a warm, moist washcloth to your face 3 to 4 times a day, or as directed by your caregiver.  Use saline nasal sprays to help moisten and clean your sinuses.  Take over-the-counter or prescription medicines for pain, discomfort, or fever only as directed by your  caregiver. SEEK IMMEDIATE MEDICAL CARE IF:  You have increasing pain or severe headaches.  You have nausea, vomiting, or drowsiness.  You have swelling around your face.  You have vision problems.  You have a stiff neck.  You have difficulty breathing. MAKE SURE YOU:   Understand these instructions.  Will watch your condition.  Will get help right away if you are not doing well or get worse. Document Released: 07/07/2005 Document Revised: 09/29/2011 Document Reviewed: 07/22/2011 [facility] Patient Information 2014 [place], [place].

## 2013-08-18 NOTE — Progress Notes (Signed)
Reviewed documentation and agree w/ assessment and plan. Eva Shaw, MD MPH 

## 2013-11-10 ENCOUNTER — Ambulatory Visit: Payer: Self-pay | Admitting: Family Medicine

## 2014-02-23 ENCOUNTER — Ambulatory Visit (INDEPENDENT_AMBULATORY_CARE_PROVIDER_SITE_OTHER): Payer: Medicare Other | Admitting: Emergency Medicine

## 2014-02-23 ENCOUNTER — Ambulatory Visit (INDEPENDENT_AMBULATORY_CARE_PROVIDER_SITE_OTHER): Payer: Medicare Other

## 2014-02-23 VITALS — BP 118/70 | HR 51 | Temp 97.9°F | Resp 16 | Ht 69.5 in | Wt 224.0 lb

## 2014-02-23 DIAGNOSIS — M10071 Idiopathic gout, right ankle and foot: Secondary | ICD-10-CM

## 2014-02-23 DIAGNOSIS — M79671 Pain in right foot: Secondary | ICD-10-CM

## 2014-02-23 DIAGNOSIS — M109 Gout, unspecified: Secondary | ICD-10-CM

## 2014-02-23 DIAGNOSIS — M79609 Pain in unspecified limb: Secondary | ICD-10-CM

## 2014-02-23 DIAGNOSIS — M79674 Pain in right toe(s): Secondary | ICD-10-CM

## 2014-02-23 LAB — POCT CBC
Granulocyte percent: 64.3 %G (ref 37–80)
HCT, POC: 43.9 % (ref 43.5–53.7)
HEMOGLOBIN: 13.8 g/dL — AB (ref 14.1–18.1)
LYMPH, POC: 1.6 (ref 0.6–3.4)
MCH: 28.4 pg (ref 27–31.2)
MCHC: 31.5 g/dL — AB (ref 31.8–35.4)
MCV: 90.2 fL (ref 80–97)
MID (CBC): 0.4 (ref 0–0.9)
MPV: 7.8 fL (ref 0–99.8)
PLATELET COUNT, POC: 264 10*3/uL (ref 142–424)
POC GRANULOCYTE: 3.5 (ref 2–6.9)
POC LYMPH PERCENT: 28.8 %L (ref 10–50)
POC MID %: 6.9 % (ref 0–12)
RBC: 4.87 M/uL (ref 4.69–6.13)
RDW, POC: 15 %
WBC: 5.4 10*3/uL (ref 4.6–10.2)

## 2014-02-23 LAB — URIC ACID: URIC ACID, SERUM: 7.6 mg/dL (ref 4.0–7.8)

## 2014-02-23 MED ORDER — MELOXICAM 7.5 MG PO TABS: ORAL_TABLET | ORAL | Status: DC

## 2014-02-23 NOTE — Progress Notes (Signed)
Subjective:    Patient ID: Clayton Kinhomas A Mahone Jr., male    DOB: 10/05/1945, 68 y.o.   MRN: [id number]  HPI This chart was scribed for Clayton GobbleSteven A Mayur Duman, MD by Phillis HaggisGabriella Gaje, ED Scribe. This patient was seen in room 11 and the patient's care was started at 9:55 AM.  HPI Comments: Clayton Kinhomas A [name] is a 68 y.o. male with a history of gout who presents to the [facility] complaining of right great toe pain onset 3 days ago. He states that his foot has been swollen and it hurts to walk on it. He also states that it feels like there is a knot on the ball of his foot. He denies it being similar to gout pain and denies any recent injury. He states that he takes 300 mg of allopurinol every day. He states that he was last checked out last year in terms of x-ray and uric acid levels. He denies any activity changesHe reports drinking some beer. 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    Past Medical History  Diagnosis Date  . Arthritis   . Cataract    Past Surgical History  Procedure Laterality Date  . Eye surgery    . Hernia repair    . Elbow surgery    . Knee surgery    . Shoulder surgery     Prior to Admission medications   Medication Sig Start Date End Date Taking? Authorizing Provider  allopurinol (ZYLOPRIM) 300 MG tablet TAKE 1 TABLET EVERY DAY . 05/12/13  Yes Maurice MarchBarbara B McPherson, MD  celecoxib (CELEBREX) 200 MG capsule Take 1 capsule (200 mg total) by mouth 2 (two) times daily. 05/12/13  Yes Maurice MarchBarbara B McPherson, MD  fish oil-omega-3 fatty acids 1000 MG capsule Take 2 g by mouth daily.   Yes Historical Provider, MD  lisinopril-hydrochlorothiazide (PRINZIDE,ZESTORETIC) 20-25 MG per tablet TAKE 1 TABLET BY MOUTH DAILY. 05/12/13  Yes Maurice MarchBarbara B McPherson, MD  Multiple Vitamin (MULTIVITAMIN) tablet Take 1 tablet by mouth daily.   Yes Historical Provider, MD  pravastatin (PRAVACHOL) 40 MG tablet  Take 1 tablet (40 mg total) by mouth daily. 05/12/13  Yes Maurice MarchBarbara B McPherson, MD  tamsulosin (FLOMAX) 0.4 MG CAPS capsule Take 2 capsules (0.8 mg total) by mouth daily. Take after same meal daily. 05/12/13  Yes Maurice MarchBarbara B McPherson, MD  zoster vaccine live, PF, (ZOSTAVAX) [id number] UNT/0.65ML injection Inject 19,400 Units into the skin once. 05/12/13   Maurice MarchBarbara B McPherson, MD   Review of Systems  Constitutional: Negative for activity change.  Musculoskeletal: Positive for arthralgias.      Objective:   Physical Exam  CONSTITUTIONAL: Well developed/well nourished HEAD: Normocephalic/atraumatic EYES: EOMI/PERRL ENMT: Mucous membranes moist NECK: supple no meningeal signs SPINE:entire spine nontender CV: S1/S2 noted, no murmurs/rubs/gallops noted LUNGS: Lungs are clear to auscultation bilaterally, no apparent distress ABDOMEN: soft, nontender, no rebound or guarding GU:no cva tenderness NEURO: Pt is awake/alert, moves all extremitiesx4 EXTREMITIES: pulses normal, full ROM. Tender over base of right great toe with mild swelling, no redness.  SKIN: warm, color normal PSYCH: no abnormalities of mood noted [facility] reading (PRIMARY) by  Dr Cleta Albertsaub there is arthritic change at the first MTP joint     Assessment & Plan:  Will treat with Mobic 7.5 one to 2 a day. He will get new tennis shoes with inserts for better arch support.I personally performed  the services described in this documentation, which was scribed in my presence. The recorded information has been reviewed and is accurate.

## 2014-03-15 ENCOUNTER — Other Ambulatory Visit: Payer: Self-pay | Admitting: Family Medicine

## 2014-03-31 ENCOUNTER — Other Ambulatory Visit: Payer: Self-pay | Admitting: Family Medicine

## 2014-03-31 NOTE — Telephone Encounter (Signed)
Dr Cleta Alberts, you Rxd Mobic for pt at last Ov. Did you want him taking both Mobic and celebrex both? They are both on his med list at [facility].

## 2014-04-08 ENCOUNTER — Ambulatory Visit (INDEPENDENT_AMBULATORY_CARE_PROVIDER_SITE_OTHER): Payer: Medicare Other | Admitting: Family Medicine

## 2014-04-08 VITALS — BP 100/70 | HR 81 | Temp 98.1°F | Resp 16 | Ht 69.0 in | Wt 216.0 lb

## 2014-04-08 DIAGNOSIS — L03119 Cellulitis of unspecified part of limb: Secondary | ICD-10-CM

## 2014-04-08 DIAGNOSIS — L03115 Cellulitis of right lower limb: Secondary | ICD-10-CM

## 2014-04-08 DIAGNOSIS — L02419 Cutaneous abscess of limb, unspecified: Secondary | ICD-10-CM

## 2014-04-08 MED ORDER — DOXYCYCLINE HYCLATE 100 MG PO TABS: 100.0000 mg | ORAL_TABLET | Freq: Two times a day (BID) | ORAL | Status: DC

## 2014-04-08 NOTE — Progress Notes (Signed)
Subjective: A couple days ago the patient developed a red area on his right lateral leg just above the knee. It has gotten worse the last couple of days. He thought he might have a staph infection again, which she's had in the past. He does not know of any insect bites or tick bites  Objective Erythematous area right leg approximately 9 x 10 cm. The central 2 or 3 CM area is darker red with one little hole in it that could be a insect bite or an inflamed skin poor.  Assessment: Cellulitis right leg  Plan: Doxycycline

## 2014-04-08 NOTE — Patient Instructions (Signed)
Take doxycycline one twice daily. Take with food but not with any dairy products.  Return if in all worse

## 2014-06-18 ENCOUNTER — Ambulatory Visit (INDEPENDENT_AMBULATORY_CARE_PROVIDER_SITE_OTHER): Payer: Medicare Other | Admitting: Family Medicine

## 2014-06-18 ENCOUNTER — Ambulatory Visit (INDEPENDENT_AMBULATORY_CARE_PROVIDER_SITE_OTHER): Payer: Medicare Other

## 2014-06-18 VITALS — BP 136/87 | HR 72 | Temp 97.9°F | Resp 12 | Ht 69.5 in | Wt 216.6 lb

## 2014-06-18 DIAGNOSIS — M25571 Pain in right ankle and joints of right foot: Secondary | ICD-10-CM

## 2014-06-18 DIAGNOSIS — M79674 Pain in right toe(s): Secondary | ICD-10-CM

## 2014-06-18 DIAGNOSIS — Z8639 Personal history of other endocrine, nutritional and metabolic disease: Secondary | ICD-10-CM

## 2014-06-18 DIAGNOSIS — Z8739 Personal history of other diseases of the musculoskeletal system and connective tissue: Secondary | ICD-10-CM

## 2014-06-18 LAB — URIC ACID: Uric Acid, Serum: 7.1 mg/dL (ref 4.0–7.8)

## 2014-06-18 NOTE — Progress Notes (Signed)
Chief Complaint:  Chief Complaint  Patient presents with  . Foot Pain    right foot pain x 6-7 weeks.  pain comes and goes.  seen by ortho for this.    HPI: Clayton Lawson. is a 68 y.o. male who is here for  7 week hx of righ foot pain, underanth MTP of right foot, big toe and 2nd toe He went to the ortho urgent care and xrays were done and was given nabumetone  And that seemed to help very little, he also has a hx of gout and is on daily allopurinol. He does not take colchicine. He has ahd steroid injections in  Foot/ankle before and that helped a lot. He has tried insoled and Metatarsal pads. He sttes they are not helping.   Past Medical History  Diagnosis Date  . Arthritis   . Cataract    Past Surgical History  Procedure Laterality Date  . Eye surgery    . Hernia repair    . Elbow surgery    . Knee surgery    . Shoulder surgery     History   Social History  . Marital Status: Married    Spouse Name: N/A    Number of Children: N/A  . Years of Education: N/A   Occupational History  . shipping    Social History Main Topics  . Smoking status: Former Smoker    Quit date: 11/09/1974  . Smokeless tobacco: Never Used  . Alcohol Use: 0.0 oz/week    0 Not specified per week     Comment: SOCIAL  . Drug Use: No  . Sexual Activity: No   Other Topics Concern  . None   Social History Narrative   Family History  Problem Relation Age of Onset  . Stroke Mother     mild   No Known Allergies Prior to Admission medications   Medication Sig Start Date End Date Taking? Authorizing Provider  allopurinol (ZYLOPRIM) 300 MG tablet TAKE 1 TABLET EVERY DAY . 05/12/13  Yes Maurice MarchBarbara B McPherson, MD  fish oil-omega-3 fatty acids 1000 MG capsule Take 2 g by mouth daily.   Yes Historical Provider, MD  lisinopril-hydrochlorothiazide (PRINZIDE,ZESTORETIC) 20-25 MG per tablet TAKE 1 TABLET BY MOUTH DAILY. 05/12/13  Yes Maurice MarchBarbara B McPherson, MD  Multiple Vitamin (MULTIVITAMIN)  tablet Take 1 tablet by mouth daily.   Yes Historical Provider, MD  pravastatin (PRAVACHOL) 40 MG tablet Take 1 tablet (40 mg total) by mouth daily. 05/12/13  Yes Maurice MarchBarbara B McPherson, MD  zoster vaccine live, PF, (ZOSTAVAX) [id number] UNT/0.65ML injection Inject 19,400 Units into the skin once. 05/12/13  Yes Maurice MarchBarbara B McPherson, MD     ROS: The patient denies fevers, chills, night sweats, unintentional weight loss, chest pain, palpitations, wheezing, dyspnea on exertion, nausea, vomiting, abdominal pain, dysuria, hematuria, melena, numbness, weakness, or tingling.   All other systems have been reviewed and were otherwise negative with the exception of those mentioned in the HPI and as above.    PHYSICAL EXAM: Filed Vitals:   06/18/14 1347  BP: 136/87  Pulse: 72  Temp: 97.9 F (36.6 C)  Resp: 12   Filed Vitals:   06/18/14 1347  Height: 5' 9.5" (1.765 m)  Weight: 216 lb 9.6 oz (98.249 kg)   Body mass index is 31.54 kg/(m^2).  General: Alert, no acute distress HEENT:  Normocephalic, atraumatic, oropharynx patent. EOMI, PERRLA Cardiovascular:  Regular rate and rhythm, no rubs murmurs or gallops.  No Carotid bruits, radial pulse intact. No pedal edema.  Respiratory: Clear to auscultation bilaterally.  No wheezes, rales, or rhonchi.  No cyanosis, no use of accessory musculature GI: No organomegaly, abdomen is soft and non-tender, positive bowel sounds.  No masses. Skin: No rashes. Neurologic: Facial musculature symmetric. Psychiatric: Patient is appropriate throughout our interaction. Lymphatic: No cervical lymphadenopathy Musculoskeletal: Gait intact.   LABS: Results for orders placed or performed in visit on 02/23/14  Uric acid  Result Value Ref Range   Uric Acid, Serum 7.6 4.0 - 7.8 mg/dL  POCT CBC  Result Value Ref Range   WBC 5.4 4.6 - 10.2 K/uL   Lymph, poc 1.6 0.6 - 3.4   POC LYMPH PERCENT 28.8 10 - 50 %L   MID (cbc) 0.4 0 - 0.9   POC MID % 6.9 0 - 12 %M   POC  Granulocyte 3.5 2 - 6.9   Granulocyte percent 64.3 37 - 80 %G   RBC 4.87 4.69 - 6.13 M/uL   Hemoglobin 13.8 (A) 14.1 - 18.1 g/dL   HCT, POC 16.143.9 09.643.5 - 53.7 %   MCV 90.2 80 - 97 fL   MCH, POC 28.4 27 - 31.2 pg   MCHC 31.5 (A) 31.8 - 35.4 g/dL   RDW, POC 04.515.0 %   Platelet Count, POC 264 142 - 424 K/uL   MPV 7.8 0 - 99.8 fL     EKG/XRAY:   [facility] ? Abnormal sesmoid bifurcation vs fx    ASSESSMENT/PLAN: Encounter Diagnoses  Name Primary?  . Pain in joint, ankle and foot, right Yes  . Great toe pain, right    Post op shoe until get results of sesmoid bone He does not want to wait for results Will give steroid prn if uric acid is WNL and this is just arthritis Uric acid pending F/u once labs and official xray results return    Gross sideeffects, risk and benefits, and alternatives of medications d/w patient. Patient is aware that all medications have potential sideeffects and we are unable to predict every sideeffect or drug-drug interaction that may occur.  LE, THAO PHUONG, DO 06/18/2014 2:28 PM

## 2014-06-19 ENCOUNTER — Telehealth: Payer: Self-pay

## 2014-06-19 NOTE — Telephone Encounter (Signed)
The patient called to check on Prednisone Rx that patient states Dr. Conley RollsLe was going to call into the pharmacy for his foot/ankle pain.  The patient states the Rx is not at the pharmacy and no mention of this in office visit notes.  Please call the patient at [phone] to discuss.

## 2014-06-20 ENCOUNTER — Encounter: Payer: Self-pay | Admitting: Family Medicine

## 2014-06-20 MED ORDER — PREDNISONE 10 MG PO TABS: ORAL_TABLET | ORAL | Status: DC

## 2014-06-20 NOTE — Telephone Encounter (Signed)
No mention of steroid use in OV.  Please advise.

## 2014-06-20 NOTE — Telephone Encounter (Signed)
LM about labs, if prednisone doe snot help then may add colchicine, he is already wearing insoles and metatarsal pads

## 2014-07-09 ENCOUNTER — Ambulatory Visit (INDEPENDENT_AMBULATORY_CARE_PROVIDER_SITE_OTHER): Payer: Medicare Other | Admitting: Emergency Medicine

## 2014-07-09 VITALS — BP 122/72 | HR 58 | Temp 97.9°F | Resp 16 | Ht 69.5 in | Wt 217.6 lb

## 2014-07-09 DIAGNOSIS — B029 Zoster without complications: Secondary | ICD-10-CM

## 2014-07-09 MED ORDER — HYDROCODONE-ACETAMINOPHEN 5-325 MG PO TABS: 1.0000 | ORAL_TABLET | ORAL | Status: DC | PRN

## 2014-07-09 MED ORDER — VALACYCLOVIR HCL 1 G PO TABS: 1000.0000 mg | ORAL_TABLET | Freq: Three times a day (TID) | ORAL | Status: DC

## 2014-07-09 NOTE — Patient Instructions (Signed)
Postherpetic Neuralgia Postherpetic neuralgia (PHN) is nerve pain that occurs after a shingles infection. Shingles is a painful rash that appears on one side of the body, usually on your trunk or face. Shingles is caused by the varicella-zoster virus. This is the same virus that causes chickenpox. In people who have had chickenpox, the virus can resurface years later and cause shingles. You may have PHN if you continue to have pain for 3 months after your shingles rash has gone away. PHN appears in the same area where you had the shingles rash. For most people, PHN goes away within 1 year.  Getting a vaccination for shingles can prevent PHN. This vaccine is recommended for people older than 50. It may prevent shingles and may also lower your risk of PHN if you do get shingles. CAUSES PHN is caused by damage to your nerves from the varicella-zoster virus. This damage makes your nerves overly sensitive.  RISK FACTORS Aging is the biggest risk factor for developing PHN. Most people who get PHN are older than 60. Other risk factors include:  Having very bad pain before your shingles rash starts.  Having a very bad rash.  Having shingles in the nerve that supplies your face and eye (trigeminal nerve). SIGNS AND SYMPTOMS Pain is the main symptom of PHN. The pain is often very bad and may be described as stabbing, burning, or feeling like an electric shock. The pain may come and go or may be there all the time. Pain may be triggered by light touches on the skin or changes in temperature. You may have itching along with the pain. DIAGNOSIS  Your health care provider may diagnose PHN based on your symptoms and your history of shingles. Lab studies and other diagnostic tests are usually not needed. TREATMENT  There is no cure for PHN. Treatment for PHN will focus on pain relief. Over-the-counter pain relievers do not usually relieve PHN pain. You may need to work with a pain specialist. Treatment may  include:  Antidepressant medicines to help with pain and improve sleep.  Antiseizure medicines to relieve nerve pain.  Strong pain relievers (opioids).  A numbing patch worn on the skin (lidocaine patch). HOME CARE INSTRUCTIONS It may take a long time to recover from PHN. Work closely with your health care provider, and have a good support system at home.   Take all medicines as directed by your health care provider.  Wear loose, comfortable clothing.  Cover sensitive areas with a dressing to reduce friction from clothing rubbing on the area.  If cold does not make your pain worse, try applying a cool compress or cooling gel pack to the area.  Talk to your health care provider if you feel depressed or desperate. Living with long-term pain can be depressing. SEEK MEDICAL CARE IF:  Your medicine is not helping.  You are struggling to manage your pain at home. Document Released: 09/27/2002 Document Revised: 11/21/2013 Document Reviewed: 06/28/2013 [employer] Patient Information 2015 [employer]. This information is not intended to replace advice given to you by your health care provider. Make sure you discuss any questions you have with your health care provider.  

## 2014-07-09 NOTE — Progress Notes (Signed)
[facility] [address], [place] [id number] [phone]  Date:  07/09/2014   Name:  Clayton Kinhomas A Walkins Jr.   DOB:  07/10/1946   MRN:  [id number]  PCP:  No PCP Per Patient    Chief Complaint: Rash   History of Present Illness:  Clayton Kinhomas A Bala Jr. is a 68 y.o. very pleasant male patient who presents with the following:  Developed a painful burning rash on the left side just above the belt line with pain in left hip Started mid week.  Never had shingles vaccination No fever or chill No nausea or vomiting No abdominal pain or GU symptoms. No improvement with over the counter medications or other home remedies.  Denies other complaint or health concern today.   Patient Active Problem List   Diagnosis Date Noted  . HTN (hypertension) 05/28/2012  . Gouty arthropathy, unspecified 05/28/2012  . BPH (benign prostatic hyperplasia) 05/28/2012  . Obesity (BMI 30.0-34.9) 05/28/2012  . Dyslipidemia 05/28/2012    Past Medical History  Diagnosis Date  . Arthritis   . Cataract   . Gout     Past Surgical History  Procedure Laterality Date  . Eye surgery    . Hernia repair    . Elbow surgery    . Knee surgery    . Shoulder surgery      History  Substance Use Topics  . Smoking status: Former Smoker    Quit date: 11/09/1974  . Smokeless tobacco: Never Used  . Alcohol Use: 0.0 oz/week    0 Not specified per week     Comment: SOCIAL    Family History  Problem Relation Age of Onset  . Stroke Mother     mild    No Known Allergies  Medication list has been reviewed and updated.  Current Outpatient Prescriptions on File Prior to Visit  Medication Sig Dispense Refill  . allopurinol (ZYLOPRIM) 300 MG tablet TAKE 1 TABLET EVERY DAY . 90 tablet 3  . fish oil-omega-3 fatty acids 1000 MG capsule Take 2 g by mouth daily.    Marland Kitchen. lisinopril-hydrochlorothiazide (PRINZIDE,ZESTORETIC) 20-25 MG per tablet TAKE 1 TABLET BY MOUTH DAILY. 90 tablet 3  . Multiple  Vitamin (MULTIVITAMIN) tablet Take 1 tablet by mouth daily.    . pravastatin (PRAVACHOL) 40 MG tablet Take 1 tablet (40 mg total) by mouth daily. 90 tablet 3  . predniSONE (DELTASONE) 10 MG tablet Take 4 tabs PO daily x 2 days, then 3 tabs  Daily x 2 days, then 2 tabs x 2 days then 1 tab x 2 days 20 tablet 0  . zoster vaccine live, PF, (ZOSTAVAX) [id number] UNT/0.65ML injection Inject 19,400 Units into the skin once. 1 each 0   No current facility-administered medications on file prior to visit.    Review of Systems:  As per HPI, otherwise negative.    Physical Examination: Filed Vitals:   07/09/14 0918  BP: 122/72  Pulse: 58  Temp: 97.9 F (36.6 C)  Resp: 16   Filed Vitals:   07/09/14 0918  Height: 5' 9.5" (1.765 m)  Weight: 217 lb 9.6 oz (98.703 kg)   Body mass index is 31.68 kg/(m^2). Ideal Body Weight: Weight in (lb) to have BMI = 25: 171.4   GEN: WDWN, NAD, Non-toxic, Alert & Oriented x 3 HEENT: Atraumatic, Normocephalic.  Ears and Nose: No external deformity. EXTR: No clubbing/cyanosis/edema NEURO: Normal gait.  PSYCH: Normally interactive. Conversant. Not depressed or anxious appearing.  Calm  demeanor.  SKIN:  Rash on left flank characteristic of shingles  Assessment and Plan: Shingles Valtrex vicodin  Signed,  Phillips OdorJeffery Lavena Loretto, MD

## 2014-08-22 ENCOUNTER — Ambulatory Visit (INDEPENDENT_AMBULATORY_CARE_PROVIDER_SITE_OTHER): Payer: Medicare Other | Admitting: Family Medicine

## 2014-08-22 VITALS — BP 130/98 | HR 77 | Temp 98.8°F | Resp 16 | Ht 69.5 in | Wt 218.0 lb

## 2014-08-22 DIAGNOSIS — Z8639 Personal history of other endocrine, nutritional and metabolic disease: Secondary | ICD-10-CM

## 2014-08-22 DIAGNOSIS — M25532 Pain in left wrist: Secondary | ICD-10-CM

## 2014-08-22 DIAGNOSIS — Z8739 Personal history of other diseases of the musculoskeletal system and connective tissue: Secondary | ICD-10-CM

## 2014-08-22 LAB — POCT CBC
Granulocyte percent: 77.2 %G (ref 37–80)
HEMATOCRIT: 42.6 % — AB (ref 43.5–53.7)
HEMOGLOBIN: 13.4 g/dL — AB (ref 14.1–18.1)
LYMPH, POC: 1.8 (ref 0.6–3.4)
MCH: 28.8 pg (ref 27–31.2)
MCHC: 31.5 g/dL — AB (ref 31.8–35.4)
MCV: 91.7 fL (ref 80–97)
MID (cbc): 0.5 (ref 0–0.9)
MPV: 6.3 fL (ref 0–99.8)
POC GRANULOCYTE: 7.8 — AB (ref 2–6.9)
POC LYMPH PERCENT: 17.9 %L (ref 10–50)
POC MID %: 4.9 % (ref 0–12)
Platelet Count, POC: 300 10*3/uL (ref 142–424)
RBC: 4.64 M/uL — AB (ref 4.69–6.13)
RDW, POC: 14.8 %
WBC: 10.1 10*3/uL (ref 4.6–10.2)

## 2014-08-22 MED ORDER — CEFTRIAXONE SODIUM 1 G IJ SOLR
1.0000 g | Freq: Once | INTRAMUSCULAR | Status: AC
  Administered 2014-08-22: 1 g via INTRAMUSCULAR

## 2014-08-22 MED ORDER — DOXYCYCLINE HYCLATE 100 MG PO CAPS: 100.0000 mg | ORAL_CAPSULE | Freq: Two times a day (BID) | ORAL | Status: DC

## 2014-08-22 MED ORDER — HYDROCODONE-ACETAMINOPHEN 5-325 MG PO TABS: 1.0000 | ORAL_TABLET | Freq: Three times a day (TID) | ORAL | Status: DC | PRN

## 2014-08-22 MED ORDER — INDOMETHACIN 50 MG PO CAPS: 50.0000 mg | ORAL_CAPSULE | Freq: Three times a day (TID) | ORAL | Status: DC

## 2014-08-22 MED ORDER — ALLOPURINOL 300 MG PO TABS: ORAL_TABLET | ORAL | Status: DC

## 2014-08-22 NOTE — Progress Notes (Signed)
[facility] [address], [place] [address] [phone]  Date:  08/22/2014   Name:  Clayton Lawson.   DOB:  [birth year]   MRN:  [id number]  PCP:  No PCP Per Patient    Chief Complaint: Pain and Edema   History of Present Illness:  Clayton Lawson. is a 69 y.o. very pleasant male patient who presents with the following:  History of gout- here today with hand pain. His left hand started to hurt him on Sunday.  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  He had insidious onset of pain.  Today is Tuesday.   Admits that he has consumed some beer and seafood lately which may have triggered his gout. He is not aware of any cut or scratch that could have caused an infection   Patient Active Problem List   Diagnosis Date Noted  . HTN (hypertension) 05/28/2012  . Gouty arthropathy, unspecified 05/28/2012  . BPH (benign prostatic hyperplasia) 05/28/2012  . Obesity (BMI 30.0-34.9) 05/28/2012  . Dyslipidemia 05/28/2012    Past Medical History  Diagnosis Date  . Arthritis   . Cataract   . Gout     Past Surgical History  Procedure Laterality Date  . Eye surgery    . Hernia repair    . Elbow surgery    . Knee surgery    . Shoulder surgery      History  Substance Use Topics  . Smoking status: Former Smoker    Quit date: 11/09/1974  . Smokeless tobacco: Never Used  . Alcohol Use: 0.0 oz/week    0 Not specified per week     Comment: SOCIAL    Family History  Problem Relation Age of Onset  . Stroke Mother     mild    No Known Allergies  Medication list has been reviewed and updated.  Current Outpatient Prescriptions on File Prior to Visit  Medication Sig Dispense Refill  . allopurinol (ZYLOPRIM) 300 MG tablet TAKE 1 TABLET EVERY DAY . 90 tablet 3  . fish oil-omega-3 fatty acids 1000 MG capsule Take 2 g by mouth daily.    Marland Kitchen lisinopril-hydrochlorothiazide  (PRINZIDE,ZESTORETIC) 20-25 MG per tablet TAKE 1 TABLET BY MOUTH DAILY. 90 tablet 3  . Multiple Vitamin (MULTIVITAMIN) tablet Take 1 tablet by mouth daily.    . pravastatin (PRAVACHOL) 40 MG tablet Take 1 tablet (40 mg total) by mouth daily. 90 tablet 3  . zoster vaccine live, PF, (ZOSTAVAX) [id number] UNT/0.65ML injection Inject 19,400 Units into the skin once. 1 each 0   No current facility-administered medications on file prior to visit.    Review of Systems:  As per HPI- otherwise negative.   Physical Examination: Filed Vitals:   08/22/14 1625  BP: 130/98  Pulse: 77  Temp: 98.8 F (37.1 C)  Resp: 16   Filed Vitals:   08/22/14 1625  Height: 5' 9.5" (1.765 m)  Weight: 218 lb (98.884 kg)   Body mass index is 31.74 kg/(m^2). Ideal Body Weight: Weight in (lb) to have BMI = 25: 171.4  GEN: WDWN, NAD, Non-toxic, A & O x 3, overweight, looks well HEENT: Atraumatic, Normocephalic. Neck supple. No masses, No LAD. Ears and Nose: No external deformity. CV: RRR, No M/G/R. No JVD. No thrill. No extra heart sounds. PULM: CTA B, no wheezes, crackles, rhonchi. No retractions. No resp. distress.  No accessory muscle use. EXTR: No c/c/e NEURO Normal gait.  PSYCH: Normally interactive. Conversant. Not depressed or anxious appearing.  Calm demeanor.  Left hand/ wrist: tenderness and redness/ warmth on the distal wrist, more on the ulnar aspect.  Hand, elbow negative.  Hand is NV intact.  Wrist movement is normal but painful  Results for orders placed or performed in visit on 08/22/14  POCT CBC  Result Value Ref Range   WBC 10.1 4.6 - 10.2 K/uL   Lymph, poc 1.8 0.6 - 3.4   POC LYMPH PERCENT 17.9 10 - 50 %L   MID (cbc) 0.5 0 - 0.9   POC MID % 4.9 0 - 12 %M   POC Granulocyte 7.8 (A) 2 - 6.9   Granulocyte percent 77.2 37 - 80 %G   RBC 4.64 (A) 4.69 - 6.13 M/uL   Hemoglobin 13.4 (A) 14.1 - 18.1 g/dL   HCT, POC 54.042.6 (A) 98.143.5 - 53.7 %   MCV 91.7 80 - 97 fL   MCH, POC 28.8 27 - 31.2 pg   MCHC  31.5 (A) 31.8 - 35.4 g/dL   RDW, POC 19.114.8 %   Platelet Count, POC 300 142 - 424 K/uL   MPV 6.3 0 - 99.8 fL    Assessment and Plan: Left wrist pain - Plan: POCT CBC, cefTRIAXone (ROCEPHIN) injection 1 g, doxycycline (VIBRAMYCIN) 100 MG capsule, HYDROcodone-acetaminophen (NORCO/VICODIN) 5-325 MG per tablet  History of gout - Plan: allopurinol (ZYLOPRIM) 300 MG tablet, indomethacin (INDOCIN) 50 MG capsule  Treat for gout but also cover for cellulitis. See patient instructions for more details.   Asked to come back or call tomorrow if not significantly improved and he agrees with plan.    Signed Abbe AmsterdamJessica Copland, MD

## 2014-08-22 NOTE — Patient Instructions (Signed)
I think that you likely have gout in your wrist but we also need to consider possible infection You got a shot of rocephin (antibiotic) today and I will also start you on doxycycline (oral antibiotic) Use the indomethacin for inflammation (ibuprofen is an ok substitute) and vicodin for more severe pain  You can also use your colchicine as you normally would for gout.  You might want to not take your cholesterol med for a day or two while taking colchicine due to risk of possible drug interaction

## 2014-08-23 ENCOUNTER — Ambulatory Visit (INDEPENDENT_AMBULATORY_CARE_PROVIDER_SITE_OTHER): Payer: Medicare Other | Admitting: Family Medicine

## 2014-08-23 ENCOUNTER — Telehealth: Payer: Self-pay

## 2014-08-23 VITALS — BP 132/84 | HR 70 | Temp 98.3°F | Resp 16 | Ht 69.0 in | Wt 215.0 lb

## 2014-08-23 DIAGNOSIS — M25532 Pain in left wrist: Secondary | ICD-10-CM

## 2014-08-23 DIAGNOSIS — M10032 Idiopathic gout, left wrist: Secondary | ICD-10-CM

## 2014-08-23 NOTE — Telephone Encounter (Signed)
He was seen today by Dr. Neva SeatGreene

## 2014-08-23 NOTE — Patient Instructions (Signed)
THis appears to be gout at this time. Continue indomethacin, stop colchicine, ok to continue doxycycline (but less likely skin infection), and recheck tomorrow if any spread of redness or other worsening. Return to the clinic or go to the nearest emergency room if any of your symptoms worsen or new symptoms occur.  Gout Gout is an inflammatory arthritis caused by a buildup of uric acid crystals in the joints. Uric acid is a chemical that is normally present in the blood. When the level of uric acid in the blood is too high it can form crystals that deposit in your joints and tissues. This causes joint redness, soreness, and swelling (inflammation). Repeat attacks are common. Over time, uric acid crystals can form into masses (tophi) near a joint, destroying bone and causing disfigurement. Gout is treatable and often preventable. CAUSES  The disease begins with elevated levels of uric acid in the blood. Uric acid is produced by your body when it breaks down a naturally found substance called purines. Certain foods you eat, such as meats and fish, contain high amounts of purines. Causes of an elevated uric acid level include:  Being passed down from parent to child (heredity).  Diseases that cause increased uric acid production (such as obesity, psoriasis, and certain cancers).  Excessive alcohol use.  Diet, especially diets rich in meat and seafood.  Medicines, including certain cancer-fighting medicines (chemotherapy), water pills (diuretics), and aspirin.  Chronic kidney disease. The kidneys are no longer able to remove uric acid well.  Problems with metabolism. Conditions strongly associated with gout include:  Obesity.  High blood pressure.  High cholesterol.  Diabetes. Not everyone with elevated uric acid levels gets gout. It is not understood why some people get gout and others do not. Surgery, joint injury, and eating too much of certain foods are some of the factors that can lead  to gout attacks. SYMPTOMS   An attack of gout comes on quickly. It causes intense pain with redness, swelling, and warmth in a joint.  Fever can occur.  Often, only one joint is involved. Certain joints are more commonly involved:  Base of the big toe.  Knee.  Ankle.  Wrist.  Finger. Without treatment, an attack usually goes away in a few days to weeks. Between attacks, you usually will not have symptoms, which is different from many other forms of arthritis. DIAGNOSIS  Your caregiver will suspect gout based on your symptoms and exam. In some cases, tests may be recommended. The tests may include:  Blood tests.  Urine tests.  X-rays.  Joint fluid exam. This exam requires a needle to remove fluid from the joint (arthrocentesis). Using a microscope, gout is confirmed when uric acid crystals are seen in the joint fluid. TREATMENT  There are two phases to gout treatment: treating the sudden onset (acute) attack and preventing attacks (prophylaxis).  Treatment of an Acute Attack.  Medicines are used. These include anti-inflammatory medicines or steroid medicines.  An injection of steroid medicine into the affected joint is sometimes necessary.  The painful joint is rested. Movement can worsen the arthritis.  You may use warm or cold treatments on painful joints, depending which works best for you.  Treatment to Prevent Attacks.  If you suffer from frequent gout attacks, your caregiver may advise preventive medicine. These medicines are started after the acute attack subsides. These medicines either help your kidneys eliminate uric acid from your body or decrease your uric acid production. You may need to stay on  these medicines for a very long time.  The early phase of treatment with preventive medicine can be associated with an increase in acute gout attacks. For this reason, during the first few months of treatment, your caregiver may also advise you to take medicines  usually used for acute gout treatment. Be sure you understand your caregiver's directions. Your caregiver may make several adjustments to your medicine dose before these medicines are effective.  Discuss dietary treatment with your caregiver or dietitian. Alcohol and drinks high in sugar and fructose and foods such as meat, poultry, and seafood can increase uric acid levels. Your caregiver or dietitian can advise you on drinks and foods that should be limited. HOME CARE INSTRUCTIONS   Do not take aspirin to relieve pain. This raises uric acid levels.  Only take over-the-counter or prescription medicines for pain, discomfort, or fever as directed by your caregiver.  Rest the joint as much as possible. When in bed, keep sheets and blankets off painful areas.  Keep the affected joint raised (elevated).  Apply warm or cold treatments to painful joints. Use of warm or cold treatments depends on which works best for you.  Use crutches if the painful joint is in your leg.  Drink enough fluids to keep your urine clear or pale yellow. This helps your body get rid of uric acid. Limit alcohol, sugary drinks, and fructose drinks.  Follow your dietary instructions. Pay careful attention to the amount of protein you eat. Your daily diet should emphasize fruits, vegetables, whole grains, and fat-free or low-fat milk products. Discuss the use of coffee, vitamin C, and cherries with your caregiver or dietitian. These may be helpful in lowering uric acid levels.  Maintain a healthy body weight. SEEK MEDICAL CARE IF:   You develop diarrhea, vomiting, or any side effects from medicines.  You do not feel better in 24 hours, or you are getting worse. SEEK IMMEDIATE MEDICAL CARE IF:   Your joint becomes suddenly more tender, and you have chills or a fever. MAKE SURE YOU:   Understand these instructions.  Will watch your condition.  Will get help right away if you are not doing well or get  worse. Document Released: 07/04/2000 Document Revised: 11/21/2013 Document Reviewed: 02/18/2012 [facility] Patient Information 2015 [place], [place]. This information is not intended to replace advice given to you by your health care provider. Make sure you discuss any questions you have with your health care provider.

## 2014-08-23 NOTE — Telephone Encounter (Signed)
Pt advised by Dr. Patsy Lageropland to give feed back on recovery; States that hand is still swollen and very sore around wrist.Please advise

## 2014-08-23 NOTE — Progress Notes (Signed)
Subjective:  This chart was scribed for Clayton StaggersJeffrey Shonika Kolasinski, MD by Haywood PaoNadim Abu Hashem, ED Scribe at [facility].The patient was seen in exam room 03 and the patient's care was started at 3:16 PM.   Patient ID: Clayton Kinhomas A Raver Jr., male    DOB: 01/07/1946, 69 y.o.   MRN: [id number] Chief Complaint  Patient presents with  . Follow-up    still having left hand swelling and pain in wrist   HPI  HPI Comments: Clayton Kinhomas A Eagleton Jr. is a 69 y.o. male with a history of gout who presents to [facility] for a follow up for left hand pain. He has loss of sleep due to pain, and more swelling this morning but it has decreased since. Wrist is most tender and hurts most within inner joint but not in skin. Pt states the redness has improved. Pt is Icing for relief and taking indomethacin, and colchicine. He has had  3 total doses colchicine. Pt takes 300 mg of allopurinol qd and colchicine for acute attacks. He was seen  een yesterday by Dr. Patsy Lageropland for left hand pain. Pt did consume seafood and beer. He was  treated with indomethacin 50 mg TID prn, 1 gram of rocephine and doxycycline to cover for possible cellulitis. Per note yesterday he had warmness, tenderness and warmth on the distal wrist, ulnar aspect. WBC of 10.1 Last uric acid 7.1 in 2015. No fever, no proximal distal pain, no chills, no other present symptoms.  Patient Active Problem List   Diagnosis Date Noted  . HTN (hypertension) 05/28/2012  . Gouty arthropathy, unspecified 05/28/2012  . BPH (benign prostatic hyperplasia) 05/28/2012  . Obesity (BMI 30.0-34.9) 05/28/2012  . Dyslipidemia 05/28/2012   Past Medical History  Diagnosis Date  . Arthritis   . Cataract   . Gout    Past Surgical History  Procedure Laterality Date  . Eye surgery    . Hernia repair    . Elbow surgery    . Knee surgery    . Shoulder surgery     No Known Allergies Prior to Admission medications   Medication Sig Start Date End Date Taking? Authorizing Provider    allopurinol (ZYLOPRIM) 300 MG tablet TAKE 1 TABLET EVERY DAY . 08/22/14  Yes Gwenlyn FoundJessica C Copland, MD  doxycycline (VIBRAMYCIN) 100 MG capsule Take 1 capsule (100 mg total) by mouth 2 (two) times daily. 08/22/14  Yes Gwenlyn FoundJessica C Copland, MD  fish oil-omega-3 fatty acids 1000 MG capsule Take 2 g by mouth daily.   Yes Historical Provider, MD  HYDROcodone-acetaminophen (NORCO/VICODIN) 5-325 MG per tablet Take 1 tablet by mouth every 8 (eight) hours as needed. 08/22/14  Yes Gwenlyn FoundJessica C Copland, MD  indomethacin (INDOCIN) 50 MG capsule Take 1 capsule (50 mg total) by mouth 3 (three) times daily with meals. 08/22/14  Yes Gwenlyn FoundJessica C Copland, MD  lisinopril-hydrochlorothiazide (PRINZIDE,ZESTORETIC) 20-25 MG per tablet TAKE 1 TABLET BY MOUTH DAILY. 05/12/13  Yes Maurice MarchBarbara B McPherson, MD  Multiple Vitamin (MULTIVITAMIN) tablet Take 1 tablet by mouth daily.   Yes Historical Provider, MD  pravastatin (PRAVACHOL) 40 MG tablet Take 1 tablet (40 mg total) by mouth daily. 05/12/13  Yes Maurice MarchBarbara B McPherson, MD  zoster vaccine live, PF, (ZOSTAVAX) [id number] UNT/0.65ML injection Inject 19,400 Units into the skin once. 05/12/13  Yes Maurice MarchBarbara B McPherson, MD   History   Social History  . Marital Status: Married    Spouse Name: N/A    Number of Children: N/A  .  Years of Education: N/A   Occupational History  . shipping    Social History Main Topics  . Smoking status: Former Smoker    Quit date: 11/09/1974  . Smokeless tobacco: Never Used  . Alcohol Use: 0.0 oz/week    0 Not specified per week     Comment: SOCIAL  . Drug Use: No  . Sexual Activity: No   Other Topics Concern  . Not on file   Social History Narrative      Review of Systems  Constitutional: Negative for fever and chills.  Musculoskeletal: Positive for joint swelling and arthralgias.  Skin: Positive for color change.       Objective:  BP 132/84 mmHg  Pulse 70  Temp(Src) 98.3 F (36.8 C)  Resp 16  Ht 5\' 9"  (1.753 m)  Wt 215 lb (97.523 kg)  BMI  31.74 kg/m2  SpO2 98%  Physical Exam  Constitutional: He is oriented to person, place, and time. He appears well-developed and well-nourished. No distress.  HENT:  Head: Normocephalic and atraumatic.  Eyes: Pupils are equal, round, and reactive to light.  Neck: Normal range of motion.  Cardiovascular: Normal rate and regular rhythm.   Pulmonary/Chest: Effort normal. No respiratory distress.  Musculoskeletal: Normal range of motion.  Neurological: He is alert and oriented to person, place, and time.  Skin: Skin is warm and dry.  12 cm by 11 cm faint erythema over dorsal left wrist and hand. outlined today There warmth and soft tissue swelling of the wrist. Able to flex and extend the wrist but pain with pronation and supination.  Able to move all fingers without pain. Slight tightness but able to achieve a grip. Decreased ROM of the left wrist. NVI distally.  Psychiatric: He has a normal mood and affect. His behavior is normal.  Nursing note and vitals reviewed.      Assessment & Plan:  Clayton Lawson. is a 69 y.o. male Pain, wrist joint, left  Suspected gout flare as pain primarily limited to joint, no systemic sx's. D/c colchicine, cont indomethacin, and although less likely - cont doxycycline to cover for possible cellulitis. Recheck tomorrow if not improving - sooner if worse - outlined area of involvement. Rtc/er precautions.   No orders of the defined types were placed in this encounter.   Patient Instructions  THis appears to be gout at this time. Continue indomethacin, stop colchicine, ok to continue doxycycline (but less likely skin infection), and recheck tomorrow if any spread of redness or other worsening. Return to the clinic or go to the nearest emergency room if any of your symptoms worsen or new symptoms occur.  Gout Gout is an inflammatory arthritis caused by a buildup of uric acid crystals in the joints. Uric acid is a chemical that is normally present in the  blood. When the level of uric acid in the blood is too high it can form crystals that deposit in your joints and tissues. This causes joint redness, soreness, and swelling (inflammation). Repeat attacks are common. Over time, uric acid crystals can form into masses (tophi) near a joint, destroying bone and causing disfigurement. Gout is treatable and often preventable. CAUSES  The disease begins with elevated levels of uric acid in the blood. Uric acid is produced by your body when it breaks down a naturally found substance called purines. Certain foods you eat, such as meats and fish, contain high amounts of purines. Causes of an elevated uric acid level include:  Being  passed down from parent to child (heredity).  Diseases that cause increased uric acid production (such as obesity, psoriasis, and certain cancers).  Excessive alcohol use.  Diet, especially diets rich in meat and seafood.  Medicines, including certain cancer-fighting medicines (chemotherapy), water pills (diuretics), and aspirin.  Chronic kidney disease. The kidneys are no longer able to remove uric acid well.  Problems with metabolism. Conditions strongly associated with gout include:  Obesity.  High blood pressure.  High cholesterol.  Diabetes. Not everyone with elevated uric acid levels gets gout. It is not understood why some people get gout and others do not. Surgery, joint injury, and eating too much of certain foods are some of the factors that can lead to gout attacks. SYMPTOMS   An attack of gout comes on quickly. It causes intense pain with redness, swelling, and warmth in a joint.  Fever can occur.  Often, only one joint is involved. Certain joints are more commonly involved:  Base of the big toe.  Knee.  Ankle.  Wrist.  Finger. Without treatment, an attack usually goes away in a few days to weeks. Between attacks, you usually will not have symptoms, which is different from many other forms of  arthritis. DIAGNOSIS  Your caregiver will suspect gout based on your symptoms and exam. In some cases, tests may be recommended. The tests may include:  Blood tests.  Urine tests.  X-rays.  Joint fluid exam. This exam requires a needle to remove fluid from the joint (arthrocentesis). Using a microscope, gout is confirmed when uric acid crystals are seen in the joint fluid. TREATMENT  There are two phases to gout treatment: treating the sudden onset (acute) attack and preventing attacks (prophylaxis).  Treatment of an Acute Attack.  Medicines are used. These include anti-inflammatory medicines or steroid medicines.  An injection of steroid medicine into the affected joint is sometimes necessary.  The painful joint is rested. Movement can worsen the arthritis.  You may use warm or cold treatments on painful joints, depending which works best for you.  Treatment to Prevent Attacks.  If you suffer from frequent gout attacks, your caregiver may advise preventive medicine. These medicines are started after the acute attack subsides. These medicines either help your kidneys eliminate uric acid from your body or decrease your uric acid production. You may need to stay on these medicines for a very long time.  The early phase of treatment with preventive medicine can be associated with an increase in acute gout attacks. For this reason, during the first few months of treatment, your caregiver may also advise you to take medicines usually used for acute gout treatment. Be sure you understand your caregiver's directions. Your caregiver may make several adjustments to your medicine dose before these medicines are effective.  Discuss dietary treatment with your caregiver or dietitian. Alcohol and drinks high in sugar and fructose and foods such as meat, poultry, and seafood can increase uric acid levels. Your caregiver or dietitian can advise you on drinks and foods that should be limited. HOME  CARE INSTRUCTIONS   Do not take aspirin to relieve pain. This raises uric acid levels.  Only take over-the-counter or prescription medicines for pain, discomfort, or fever as directed by your caregiver.  Rest the joint as much as possible. When in bed, keep sheets and blankets off painful areas.  Keep the affected joint raised (elevated).  Apply warm or cold treatments to painful joints. Use of warm or cold treatments depends on which works  best for you.  Use crutches if the painful joint is in your leg.  Drink enough fluids to keep your urine clear or pale yellow. This helps your body get rid of uric acid. Limit alcohol, sugary drinks, and fructose drinks.  Follow your dietary instructions. Pay careful attention to the amount of protein you eat. Your daily diet should emphasize fruits, vegetables, whole grains, and fat-free or low-fat milk products. Discuss the use of coffee, vitamin C, and cherries with your caregiver or dietitian. These may be helpful in lowering uric acid levels.  Maintain a healthy body weight. SEEK MEDICAL CARE IF:   You develop diarrhea, vomiting, or any side effects from medicines.  You do not feel better in 24 hours, or you are getting worse. SEEK IMMEDIATE MEDICAL CARE IF:   Your joint becomes suddenly more tender, and you have chills or a fever. MAKE SURE YOU:   Understand these instructions.  Will watch your condition.  Will get help right away if you are not doing well or get worse. Document Released: 07/04/2000 Document Revised: 11/21/2013 Document Reviewed: 02/18/2012 [facility] Patient Information 2015 [place], [place]. This information is not intended to replace advice given to you by your health care provider. Make sure you discuss any questions you have with your health care provider.     I personally performed the services described in this documentation, which was scribed in my presence. The recorded information has been reviewed and  considered, and addended by me as needed.

## 2014-11-19 ENCOUNTER — Other Ambulatory Visit: Payer: Self-pay | Admitting: Family Medicine

## 2014-12-06 ENCOUNTER — Encounter: Payer: Self-pay | Admitting: *Deleted

## 2014-12-07 ENCOUNTER — Ambulatory Visit (INDEPENDENT_AMBULATORY_CARE_PROVIDER_SITE_OTHER): Payer: Medicare Other | Admitting: Emergency Medicine

## 2014-12-07 VITALS — BP 120/82 | HR 60 | Temp 97.7°F | Resp 16 | Ht 69.5 in | Wt 214.0 lb

## 2014-12-07 DIAGNOSIS — I10 Essential (primary) hypertension: Secondary | ICD-10-CM

## 2014-12-07 LAB — POCT URINALYSIS DIPSTICK
Bilirubin, UA: NEGATIVE
Glucose, UA: NEGATIVE
KETONES UA: NEGATIVE
Leukocytes, UA: NEGATIVE
Nitrite, UA: NEGATIVE
PROTEIN UA: NEGATIVE
RBC UA: NEGATIVE
SPEC GRAV UA: 1.02
Urobilinogen, UA: 0.2
pH, UA: 6

## 2014-12-07 LAB — CBC
HCT: 43.3 % (ref 39.0–52.0)
HEMOGLOBIN: 14.7 g/dL (ref 13.0–17.0)
MCH: 30.2 pg (ref 26.0–34.0)
MCHC: 33.9 g/dL (ref 30.0–36.0)
MCV: 89.1 fL (ref 78.0–100.0)
MPV: 9.8 fL (ref 8.6–12.4)
PLATELETS: 244 10*3/uL (ref 150–400)
RBC: 4.86 MIL/uL (ref 4.22–5.81)
RDW: 14.2 % (ref 11.5–15.5)
WBC: 5.8 10*3/uL (ref 4.0–10.5)

## 2014-12-07 LAB — COMPREHENSIVE METABOLIC PANEL
ALBUMIN: 4.2 g/dL (ref 3.5–5.2)
ALT: 19 U/L (ref 0–53)
AST: 19 U/L (ref 0–37)
Alkaline Phosphatase: 61 U/L (ref 39–117)
BUN: 24 mg/dL — ABNORMAL HIGH (ref 6–23)
CO2: 29 mEq/L (ref 19–32)
CREATININE: 0.87 mg/dL (ref 0.50–1.35)
Calcium: 8.8 mg/dL (ref 8.4–10.5)
Chloride: 105 mEq/L (ref 96–112)
Glucose, Bld: 103 mg/dL — ABNORMAL HIGH (ref 70–99)
POTASSIUM: 4.4 meq/L (ref 3.5–5.3)
Sodium: 141 mEq/L (ref 135–145)
Total Bilirubin: 0.5 mg/dL (ref 0.2–1.2)
Total Protein: 6.8 g/dL (ref 6.0–8.3)

## 2014-12-07 LAB — POCT UA - MICROSCOPIC ONLY
BACTERIA, U MICROSCOPIC: NEGATIVE
CASTS, UR, LPF, POC: NEGATIVE
CRYSTALS, UR, HPF, POC: NEGATIVE
Mucus, UA: NEGATIVE
Yeast, UA: NEGATIVE

## 2014-12-07 LAB — LIPID PANEL
Cholesterol: 190 mg/dL (ref 0–200)
HDL: 41 mg/dL (ref >=40)
LDL CALC: 124 mg/dL — AB (ref 0–99)
Total CHOL/HDL Ratio: 4.6 Ratio
Triglycerides: 126 mg/dL (ref <150)
VLDL: 25 mg/dL (ref 0–40)

## 2014-12-07 MED ORDER — LISINOPRIL-HYDROCHLOROTHIAZIDE 20-25 MG PO TABS: ORAL_TABLET | ORAL | Status: DC

## 2014-12-07 NOTE — Progress Notes (Signed)
Subjective:  Patient ID: Clayton Kinhomas A Deshmukh Jr., male    DOB: 09/05/1945  Age: 69 y.o. MRN: [id number]  CC: Medication Refill   HPI Clayton Kinhomas A Dejoseph Jr. presents for renewal of his blood pressure medicine. He still is taking is lisinopril and hydrochlorothiazide. He has run out of pravastatin. He said that is blood pressure is "normal" and he only takes medicine with no indication.  He's had no lab work done in the last 2 years.  He has little understanding of the need for blood pressure medicine as his blood pressure is well controlled on the dose that he is taking.  He has no other medical concerns.  Outpatient Prescriptions Prior to Visit  Medication Sig Dispense Refill  . allopurinol (ZYLOPRIM) 300 MG tablet TAKE 1 TABLET EVERY DAY . 90 tablet 3  . doxycycline (VIBRAMYCIN) 100 MG capsule Take 1 capsule (100 mg total) by mouth 2 (two) times daily. 20 capsule 0  . fish oil-omega-3 fatty acids 1000 MG capsule Take 2 g by mouth daily.    Marland Kitchen. HYDROcodone-acetaminophen (NORCO/VICODIN) 5-325 MG per tablet Take 1 tablet by mouth every 8 (eight) hours as needed. 20 tablet 0  . indomethacin (INDOCIN) 50 MG capsule Take 1 capsule (50 mg total) by mouth 3 (three) times daily with meals. 40 capsule 0  . Multiple Vitamin (MULTIVITAMIN) tablet Take 1 tablet by mouth daily.    . pravastatin (PRAVACHOL) 40 MG tablet Take 1 tablet (40 mg total) by mouth daily. 90 tablet 3  . zoster vaccine live, PF, (ZOSTAVAX) [id number] UNT/0.65ML injection Inject 19,400 Units into the skin once. 1 each 0  . lisinopril-hydrochlorothiazide (PRINZIDE,ZESTORETIC) 20-25 MG per tablet TAKE 1 TABLET BY MOUTH DAILY. 90 tablet 3   No facility-administered medications prior to visit.    ROS Review of Systems  Constitutional: Negative for fever, chills and appetite change.  HENT: Negative for congestion, ear pain, postnasal drip, sinus pressure and sore throat.   Eyes: Negative for pain and redness.  Respiratory: Negative for  cough, shortness of breath and wheezing.   Cardiovascular: Negative for leg swelling.  Gastrointestinal: Negative for nausea, vomiting, abdominal pain, diarrhea, constipation and blood in stool.  Endocrine: Negative for polyuria.  Genitourinary: Negative for dysuria, urgency, frequency and flank pain.  Musculoskeletal: Negative for gait problem.  Skin: Negative for rash.  Neurological: Negative for weakness and headaches.  Psychiatric/Behavioral: Negative for confusion and decreased concentration. The patient is not nervous/anxious.     Objective:  BP 120/82 mmHg  Pulse 60  Temp(Src) 97.7 F (36.5 C) (Oral)  Resp 16  Ht 5' 9.5" (1.765 m)  Wt 214 lb (97.07 kg)  BMI 31.16 kg/m2  SpO2 98%  BP Readings from Last 3 Encounters:  12/07/14 120/82  08/23/14 132/84  08/22/14 130/98    Wt Readings from Last 3 Encounters:  12/07/14 214 lb (97.07 kg)  08/23/14 215 lb (97.523 kg)  08/22/14 218 lb (98.884 kg)    Physical Exam  Constitutional: He is oriented to person, place, and time. He appears well-developed and well-nourished.  HENT:  Head: Normocephalic and atraumatic.  Eyes: Conjunctivae are normal. Pupils are equal, round, and reactive to light.  Neck: Normal range of motion. Neck supple.  Cardiovascular: Normal rate, regular rhythm and normal heart sounds.   Pulmonary/Chest: Effort normal. No respiratory distress. He has no wheezes. He has no rales.  Abdominal: Soft. Bowel sounds are normal.  Musculoskeletal: He exhibits no edema.  Neurological: He is alert and oriented to  person, place, and time.  Skin: Skin is dry.  Psychiatric: He has a normal mood and affect. His behavior is normal. Thought content normal.    Lab Results  Component Value Date   WBC 10.1 08/22/2014   HGB 13.4* 08/22/2014   HCT 42.6* 08/22/2014   PLT 293 05/28/2012   GLUCOSE 96 05/12/2013   CHOL 168 05/12/2013   TRIG 158* 05/12/2013   HDL 42 05/12/2013   LDLCALC 94 05/12/2013   ALT 24 05/12/2013     AST 20 05/12/2013   NA 140 05/12/2013   K 4.3 05/12/2013   CL 104 05/12/2013   CREATININE 0.94 05/12/2013   BUN 19 05/12/2013   CO2 29 05/12/2013   PSA 0.77 05/12/2013      Assessment & Plan:   Clayton Lawson was seen today for medication refill.  Diagnoses and all orders for this visit:  Essential hypertension, benign Orders: -     Comprehensive metabolic panel -     Lipid panel -     Urinalysis -     CBC -     POCT urinalysis dipstick -     POCT UA - Microscopic Only  Other orders -     lisinopril-hydrochlorothiazide (PRINZIDE,ZESTORETIC) 20-25 MG per tablet; TAKE 1 TABLET BY MOUTH DAILY.   I am having Clayton Lawson maintain his fish oil-omega-3 fatty acids, multivitamin, zoster vaccine live (PF), pravastatin, allopurinol, doxycycline, HYDROcodone-acetaminophen, indomethacin, and lisinopril-hydrochlorothiazide.  Meds ordered this encounter  Medications  . lisinopril-hydrochlorothiazide (PRINZIDE,ZESTORETIC) 20-25 MG per tablet    Sig: TAKE 1 TABLET BY MOUTH DAILY.    Dispense:  90 tablet    Refill:  3     Follow-up: Return in 6 months (on 06/09/2015).  Carmelina DaneAnderson, Jeffery S, MD

## 2014-12-13 ENCOUNTER — Ambulatory Visit (INDEPENDENT_AMBULATORY_CARE_PROVIDER_SITE_OTHER): Payer: Medicare Other | Admitting: Physician Assistant

## 2014-12-13 VITALS — BP 128/78 | HR 92 | Temp 98.6°F | Resp 18 | Wt 214.6 lb

## 2014-12-13 DIAGNOSIS — N400 Enlarged prostate without lower urinary tract symptoms: Secondary | ICD-10-CM | POA: Diagnosis not present

## 2014-12-13 DIAGNOSIS — R300 Dysuria: Secondary | ICD-10-CM | POA: Diagnosis not present

## 2014-12-13 DIAGNOSIS — E785 Hyperlipidemia, unspecified: Secondary | ICD-10-CM | POA: Diagnosis not present

## 2014-12-13 DIAGNOSIS — N390 Urinary tract infection, site not specified: Secondary | ICD-10-CM | POA: Diagnosis not present

## 2014-12-13 LAB — POCT URINALYSIS DIPSTICK
Bilirubin, UA: NEGATIVE
GLUCOSE UA: NEGATIVE
KETONES UA: NEGATIVE
Nitrite, UA: NEGATIVE
PH UA: 6
Protein, UA: NEGATIVE
Spec Grav, UA: 1.02
Urobilinogen, UA: 0.2

## 2014-12-13 LAB — POCT UA - MICROSCOPIC ONLY
Casts, Ur, LPF, POC: NEGATIVE
Crystals, Ur, HPF, POC: NEGATIVE
Mucus, UA: NEGATIVE
Yeast, UA: NEGATIVE

## 2014-12-13 MED ORDER — CIPROFLOXACIN HCL 500 MG PO TABS: 500.0000 mg | ORAL_TABLET | Freq: Two times a day (BID) | ORAL | Status: AC

## 2014-12-13 MED ORDER — PRAVASTATIN SODIUM 40 MG PO TABS: 40.0000 mg | ORAL_TABLET | Freq: Every day | ORAL | Status: DC

## 2014-12-13 NOTE — Progress Notes (Signed)
Subjective:    Patient ID: Clayton Kin., male    DOB: 1946-02-06, 69 y.o.   MRN: [id number]  HPI  This is a 69 year old male with PMH BPH, HTN, HLD and gout who is presenting with dysuria x 3 days. He has been having chills, myalgias and low back pain. Reports having a weak stream but this is normal for him. Has been having urinary frequency and nocturia. Normally goes to the bathroom once a night. Now going to the bathroom 3-4 times at night. Has not taken anything for his symptoms. Pt is married but not sexually active. He has had 3 UTIs before - last UTI in 2013. He has taken flomax and doxazosin before but states he never takes it for long enough to make a difference. He states he stops taking it "for no reason". He has never seen a urologist before. He denies rectal pain, hematuria, abdominal pain, N/V/D.  Pt also needing refill of pravastatin. Has been off the pravastatin for 1 year. States he stopped taking it again "for no reason". No history of heart disease. No family history of heart disease. He was last seen here 1 week ago to get a refill of his blood pressure medicine and saw Dr. Dareen Piano. Lipid panel showed total cholesterol 190 and LDL 124. Pt states he was told he should go back on the pravastatin.  Review of Systems  Constitutional: Positive for chills and fatigue. Negative for fever.  Respiratory: Negative for cough.   Gastrointestinal: Negative for nausea, vomiting, abdominal pain, diarrhea and rectal pain.  Genitourinary: Positive for dysuria and frequency. Negative for hematuria and testicular pain.  Musculoskeletal: Positive for myalgias and back pain.  Skin: Negative for rash.  Hematological: Negative for adenopathy.    Patient Active Problem List   Diagnosis Date Noted  . HTN (hypertension) 05/28/2012  . Gouty arthropathy, unspecified 05/28/2012  . BPH (benign prostatic hyperplasia) 05/28/2012  . Obesity (BMI 30.0-34.9) 05/28/2012  . Dyslipidemia 05/28/2012     Prior to Admission medications   Medication Sig Start Date End Date Taking? Authorizing Provider  allopurinol (ZYLOPRIM) 300 MG tablet TAKE 1 TABLET EVERY DAY . 08/22/14  Yes Gwenlyn Found Copland, MD  fish oil-omega-3 fatty acids 1000 MG capsule Take 2 g by mouth daily.   Yes Historical Provider, MD  lisinopril-hydrochlorothiazide (PRINZIDE,ZESTORETIC) 20-25 MG per tablet TAKE 1 TABLET BY MOUTH DAILY. 12/07/14  Yes Carmelina Dane, MD  Multiple Vitamin (MULTIVITAMIN) tablet Take 1 tablet by mouth daily.   Yes Historical Provider, MD  pravastatin (PRAVACHOL) 40 MG tablet Take 1 tablet (40 mg total) by mouth daily. 05/12/13  Yes Maurice March, MD   Allergies  Allergen Reactions  . Benadryl [Diphenhydramine Hcl] Anxiety   Patient's social and family history were reviewed.     Objective:   Physical Exam  Constitutional: He is oriented to person, place, and time. He appears well-developed and well-nourished. No distress.  HENT:  Head: Normocephalic and atraumatic.  Right Ear: Hearing normal.  Left Ear: Hearing normal.  Nose: Nose normal.  Mouth/Throat: Oropharynx is clear and moist and mucous membranes are normal.  Absent uvula  Eyes: Conjunctivae and lids are normal. Right eye exhibits no discharge. Left eye exhibits no discharge. No scleral icterus.  Cardiovascular: Normal rate, regular rhythm, normal heart sounds and normal pulses.   No murmur heard. Pulmonary/Chest: Effort normal and breath sounds normal. No respiratory distress. He has no wheezes. He has no rhonchi. He has no  rales.  Abdominal: Soft. Normal appearance. There is no tenderness. There is no CVA tenderness. A hernia (umbilical) is present.  Musculoskeletal: Normal range of motion.  Lymphadenopathy:       Head (right side): No submental, no submandibular and no tonsillar adenopathy present.       Head (left side): No submental, no submandibular and no tonsillar adenopathy present.    He has no cervical adenopathy.   Neurological: He is alert and oriented to person, place, and time.  Skin: Skin is warm, dry and intact. No lesion and no rash noted.  Psychiatric: He has a normal mood and affect. His speech is normal and behavior is normal. Thought content normal.   BP 128/78 mmHg  Pulse 92  Temp(Src) 98.6 F (37 C) (Oral)  Resp 18  Wt 214 lb 9.6 oz (97.342 kg)  SpO2 98%  Results for orders placed or performed in visit on 12/13/14  POCT urinalysis dipstick  Result Value Ref Range   Color, UA yellow    Clarity, UA hazy    Glucose, UA neg    Bilirubin, UA neg    Ketones, UA neg    Spec Grav, UA 1.020    Blood, UA tr-lysed    pH, UA 6.0    Protein, UA neg    Urobilinogen, UA 0.2    Nitrite, UA neg    Leukocytes, UA moderate (2+)   POCT UA - Microscopic Only  Result Value Ref Range   WBC, Ur, HPF, POC 20-30    RBC, urine, microscopic 0-1    Bacteria, U Microscopic small    Mucus, UA neg    Epithelial cells, urine per micros 0-1    Crystals, Ur, HPF, POC neg    Casts, Ur, LPF, POC neg    Yeast, UA neg       Assessment & Plan:  1. Dysuria 2. Complicated UTI 3. BPH UA with moderate leuks. Urine culture pending. Will treat for complicated UTI with cipro 500 mg BID x 7 days. Pt has not need a urologist before. He would likely benefit from a medication for BPH. Referred to urology for management of BPH and evaluation of recurrent UTIs.  - POCT urinalysis dipstick - POCT UA - Microscopic Only - ciprofloxacin (CIPRO) 500 MG tablet; Take 1 tablet (500 mg total) by mouth 2 (two) times daily.  Dispense: 14 tablet; Refill: 0 - Ambulatory referral to Urology - Urine culture  3. Hyperlipidemia Pravastatin refilled. - pravastatin (PRAVACHOL) 40 MG tablet; Take 1 tablet (40 mg total) by mouth daily.  Dispense: 90 tablet; Refill: 3   Nicole V. Dyke BrackettBush, PA-C, MHS [facility]  12/13/2014

## 2014-12-13 NOTE — Patient Instructions (Signed)
Take cipro twice a day for 10 days. Drink plenty of water. You will get a phone call to make appointment with urologist. Start back taking pravastatin daily. Return if your symptoms are not improving after finishing antibiotic.

## 2014-12-15 LAB — URINE CULTURE: Colony Count: >=100000

## 2015-01-19 DIAGNOSIS — F1021 Alcohol dependence, in remission: Secondary | ICD-10-CM

## 2015-01-19 HISTORY — DX: Alcohol dependence, in remission: F10.21

## 2015-02-28 ENCOUNTER — Ambulatory Visit (INDEPENDENT_AMBULATORY_CARE_PROVIDER_SITE_OTHER): Payer: Medicare Other | Admitting: Family Medicine

## 2015-02-28 ENCOUNTER — Encounter: Payer: Self-pay | Admitting: Family Medicine

## 2015-02-28 ENCOUNTER — Ambulatory Visit (INDEPENDENT_AMBULATORY_CARE_PROVIDER_SITE_OTHER): Payer: Medicare Other

## 2015-02-28 VITALS — BP 126/76 | HR 72 | Temp 98.1°F | Resp 16 | Ht 69.5 in | Wt 212.6 lb

## 2015-02-28 DIAGNOSIS — M2550 Pain in unspecified joint: Secondary | ICD-10-CM

## 2015-02-28 DIAGNOSIS — Z0000 Encounter for general adult medical examination without abnormal findings: Secondary | ICD-10-CM

## 2015-02-28 DIAGNOSIS — E66811 Obesity, class 1: Secondary | ICD-10-CM

## 2015-02-28 DIAGNOSIS — I10 Essential (primary) hypertension: Secondary | ICD-10-CM

## 2015-02-28 DIAGNOSIS — Z23 Encounter for immunization: Secondary | ICD-10-CM | POA: Diagnosis not present

## 2015-02-28 DIAGNOSIS — M1009 Idiopathic gout, multiple sites: Secondary | ICD-10-CM | POA: Diagnosis not present

## 2015-02-28 DIAGNOSIS — E669 Obesity, unspecified: Secondary | ICD-10-CM

## 2015-02-28 DIAGNOSIS — E785 Hyperlipidemia, unspecified: Secondary | ICD-10-CM

## 2015-02-28 DIAGNOSIS — M109 Gout, unspecified: Secondary | ICD-10-CM

## 2015-02-28 DIAGNOSIS — Z1211 Encounter for screening for malignant neoplasm of colon: Secondary | ICD-10-CM

## 2015-02-28 DIAGNOSIS — Z125 Encounter for screening for malignant neoplasm of prostate: Secondary | ICD-10-CM | POA: Diagnosis not present

## 2015-02-28 DIAGNOSIS — N400 Enlarged prostate without lower urinary tract symptoms: Secondary | ICD-10-CM

## 2015-02-28 LAB — CBC WITH DIFFERENTIAL/PLATELET
BASOS ABS: 0 10*3/uL (ref 0.0–0.1)
Basophils Relative: 0 % (ref 0–1)
Eosinophils Absolute: 0.2 10*3/uL (ref 0.0–0.7)
Eosinophils Relative: 3 % (ref 0–5)
HEMATOCRIT: 39.4 % (ref 39.0–52.0)
HEMOGLOBIN: 13.5 g/dL (ref 13.0–17.0)
LYMPHS ABS: 1.2 10*3/uL (ref 0.7–4.0)
LYMPHS PCT: 18 % (ref 12–46)
MCH: 29.9 pg (ref 26.0–34.0)
MCHC: 34.3 g/dL (ref 30.0–36.0)
MCV: 87.2 fL (ref 78.0–100.0)
MONOS PCT: 9 % (ref 3–12)
MPV: 9.6 fL (ref 8.6–12.4)
Monocytes Absolute: 0.6 10*3/uL (ref 0.1–1.0)
NEUTROS PCT: 70 % (ref 43–77)
Neutro Abs: 4.8 10*3/uL (ref 1.7–7.7)
Platelets: 269 10*3/uL (ref 150–400)
RBC: 4.52 MIL/uL (ref 4.22–5.81)
RDW: 14.2 % (ref 11.5–15.5)
WBC: 6.8 10*3/uL (ref 4.0–10.5)

## 2015-02-28 LAB — POCT URINALYSIS DIPSTICK
Bilirubin, UA: NEGATIVE
Blood, UA: NEGATIVE
Glucose, UA: NEGATIVE
Ketones, UA: NEGATIVE
LEUKOCYTES UA: NEGATIVE
Nitrite, UA: NEGATIVE
PROTEIN UA: NEGATIVE
Spec Grav, UA: 1.02
Urobilinogen, UA: 0.2
pH, UA: 6.5

## 2015-02-28 LAB — LIPID PANEL
CHOL/HDL RATIO: 4.6 ratio (ref <=5.0)
CHOLESTEROL: 171 mg/dL (ref 125–200)
HDL: 37 mg/dL — ABNORMAL LOW (ref >=40)
LDL Cholesterol: 94 mg/dL (ref <130)
Triglycerides: 199 mg/dL — ABNORMAL HIGH (ref <150)
VLDL: 40 mg/dL — ABNORMAL HIGH (ref <30)

## 2015-02-28 LAB — COMPREHENSIVE METABOLIC PANEL
ALT: 21 U/L (ref 9–46)
AST: 21 U/L (ref 10–35)
Albumin: 4.2 g/dL (ref 3.6–5.1)
Alkaline Phosphatase: 64 U/L (ref 40–115)
BUN: 20 mg/dL (ref 7–25)
CO2: 30 mmol/L (ref 20–31)
CREATININE: 0.93 mg/dL (ref 0.70–1.25)
Calcium: 8.9 mg/dL (ref 8.6–10.3)
Chloride: 102 mmol/L (ref 98–110)
Glucose, Bld: 105 mg/dL — ABNORMAL HIGH (ref 65–99)
Potassium: 4.4 mmol/L (ref 3.5–5.3)
Sodium: 140 mmol/L (ref 135–146)
Total Bilirubin: 0.7 mg/dL (ref 0.2–1.2)
Total Protein: 6.8 g/dL (ref 6.1–8.1)

## 2015-02-28 MED ORDER — ZOSTER VACCINE LIVE 19400 UNT/0.65ML ~~LOC~~ SOLR: 0.6500 mL | Freq: Once | SUBCUTANEOUS | Status: DC

## 2015-02-28 MED ORDER — LISINOPRIL-HYDROCHLOROTHIAZIDE 20-25 MG PO TABS: ORAL_TABLET | ORAL | Status: DC

## 2015-02-28 MED ORDER — MELOXICAM 15 MG PO TABS: 15.0000 mg | ORAL_TABLET | Freq: Every day | ORAL | Status: DC

## 2015-02-28 NOTE — Patient Instructions (Signed)

## 2015-02-28 NOTE — Progress Notes (Signed)
Subjective:    Patient ID: Clayton Kin., male    DOB: 1945/09/12, 69 y.o.   MRN: [id number]  02/28/2015  Annual Exam and Medication Refill   HPI This 69 y.o. male presents for Annual Wellness Visit and to establish with a new provider; previous patient of Dr. Audria Nine.  Last physical: 05-12-2013 [facility] Colonoscopy:  S/p colonoscopies x 3; last 2006; need to schedule another one; last one performed by unsure.  [address] of [address].   TDAP:  2010 Pneumovax:  2007;no previous Prevnar 13. Zostavax: never; agreeable with rx; history of mild shingles.   Influenza:  2014 Eye exam: s/p cataract surgery B.  Reading glasses.  No glaucoma.  Dental exam:  Once yearly.    Hypertension:  Patient reports good compliance with medication, good tolerance to medication, and good symptom control.  Home BP monitor; runs unsure.    Hypercholesterolemia: Patient reports good compliance with medication, good tolerance to medication, and good symptom control.    Gout:  Patient reports good compliance with medication, good tolerance to medication, and good symptom control.  Has L ankle, R first toe, L ankle.  Taking Allopurinol 300mg  daily.  One flare per year on Allopurinol.  Taking Colcrys for PRN.    BPH:  No urination issues; no nocturia.  Urinary stream is strong.  No urologist but does have appointment scheduled on 04/11/15.  Had suffered with two infections.    Arthralgias: physical job; stocks constantly during the day; B shoulders, B knees, lower back; onset since starting this current job; several years.  Orthopedist is [facility], [facility].  Review of Systems  Constitutional: Negative for fever, chills, diaphoresis, activity change, appetite change, fatigue and unexpected weight change.  HENT: Negative for congestion, dental problem, drooling, ear discharge, ear pain, facial swelling, hearing loss, mouth sores, nosebleeds, postnasal drip, rhinorrhea, sinus pressure, sneezing, sore throat,  tinnitus, trouble swallowing and voice change.   Eyes: Positive for visual disturbance. Negative for photophobia, pain, discharge, redness and itching.  Respiratory: Negative for apnea, cough, choking, chest tightness, shortness of breath, wheezing and stridor.   Cardiovascular: Negative for chest pain, palpitations and leg swelling.  Gastrointestinal: Negative for nausea, vomiting, abdominal pain, diarrhea, constipation and blood in stool.  Endocrine: Negative for cold intolerance, heat intolerance, polydipsia, polyphagia and polyuria.  Genitourinary: Negative for dysuria, urgency, frequency, hematuria, flank pain, decreased urine volume, discharge, penile swelling, scrotal swelling, enuresis, difficulty urinating, genital sores, penile pain and testicular pain.  Musculoskeletal: Positive for back pain and arthralgias. Negative for myalgias, joint swelling, gait problem, neck pain and neck stiffness.  Skin: Negative for color change, pallor, rash and wound.  Allergic/Immunologic: Negative for environmental allergies, food allergies and immunocompromised state.  Neurological: Negative for dizziness, tremors, seizures, syncope, facial asymmetry, speech difficulty, weakness, light-headedness, numbness and headaches.  Hematological: Negative for adenopathy. Does not bruise/bleed easily.  Psychiatric/Behavioral: Negative for suicidal ideas, hallucinations, behavioral problems, confusion, sleep disturbance, self-injury, dysphoric mood, decreased concentration and agitation. The patient is not nervous/anxious and is not hyperactive.     Past Medical History  Diagnosis Date  . Arthritis   . Cataract   . Gout   . Hypertension   . Hyperlipidemia   . BPH (benign prostatic hyperplasia)    Past Surgical History  Procedure Laterality Date  . Elbow surgery    . Knee surgery    . Shoulder surgery    . Eye surgery Bilateral 2013    LENS IMPLANT  . Vasectomy  1980  . Hernia repair  1954 and 1965    Allergies  Allergen Reactions  . Benadryl [Diphenhydramine Hcl] Anxiety   Current Outpatient Prescriptions  Medication Sig Dispense Refill  . allopurinol (ZYLOPRIM) 300 MG tablet TAKE 1 TABLET EVERY DAY . 90 tablet 3  . fish oil-omega-3 fatty acids 1000 MG capsule Take 2 g by mouth daily.    Marland Kitchen lisinopril-hydrochlorothiazide (PRINZIDE,ZESTORETIC) 20-25 MG per tablet TAKE 1 TABLET BY MOUTH DAILY. 90 tablet 3  . Multiple Vitamin (MULTIVITAMIN) tablet Take 1 tablet by mouth daily.    . pravastatin (PRAVACHOL) 40 MG tablet Take 1 tablet (40 mg total) by mouth daily. 90 tablet 3  . meloxicam (MOBIC) 15 MG tablet Take 1 tablet (15 mg total) by mouth daily. 90 tablet 3  . zoster vaccine live, PF, (ZOSTAVAX) [address] UNT/0.65ML injection Inject 19,400 Units into the skin once. 1 each 0   No current facility-administered medications for this visit.   Social History   Social History  . Marital Status: Married    Spouse Name: N/A  . Number of Children: N/A  . Years of Education: N/A   Occupational History  . shipping    Social History Main Topics  . Smoking status: Former Smoker    Quit date: 11/09/1974  . Smokeless tobacco: Never Used  . Alcohol Use: 0.0 oz/week    0 Standard drinks or equivalent per week     Comment: SOCIAL  . Drug Use: No  . Sexual Activity: No   Other Topics Concern  . Not on file   Social History Narrative   Marital status: married x 43 years; happily married.      Children:  2 CHILDREN; 6 grandchildren; no gg.      Employment:  Full time Nature conservation officer at [employer].      Tobacco: quit 1976; smoked for 15 years      Alcohol:  2 drinks per day beer or wine      Exercise doing cardio and light weights; job is physical.  Previous marathon runner.      Seatbelt: 100%      Advanced Directives:  Yes; +living will; +FULL CODE but no prolonged measures.        Guns: unloaded and unsecured.      ADLs: independent with all ADLs.     Family History  Problem  Relation Age of Onset  . Stroke Mother     few mini strokes/TIAs  . Cerebral palsy Sister        Objective:    BP 126/76 mmHg  Pulse 72  Temp(Src) 98.1 F (36.7 C) (Oral)  Resp 16  Ht 5' 9.5" (1.765 m)  Wt 212 lb 9.6 oz (96.435 kg)  BMI 30.96 kg/m2 Physical Exam  Constitutional: He is oriented to person, place, and time. He appears well-developed and well-nourished. No distress.  HENT:  Head: Normocephalic and atraumatic.  Right Ear: External ear normal.  Left Ear: External ear normal.  Nose: Nose normal.  Mouth/Throat: Oropharynx is clear and moist.  Eyes: Conjunctivae and EOM are normal. Pupils are equal, round, and reactive to light.  Neck: Normal range of motion. Neck supple. Carotid bruit is not present. No thyromegaly present.  Cardiovascular: Normal rate, regular rhythm, normal heart sounds and intact distal pulses.  Exam reveals no gallop and no friction rub.   No murmur heard. Pulmonary/Chest: Effort normal and breath sounds normal. He has no wheezes. He has no rales.  Abdominal: Soft. Bowel sounds are  normal. He exhibits no distension and no mass. There is no tenderness. There is no rebound and no guarding.  Musculoskeletal:       Right shoulder: Normal.       Left shoulder: Normal.       Cervical back: Normal.  Lymphadenopathy:    He has no cervical adenopathy.  Neurological: He is alert and oriented to person, place, and time. He has normal reflexes. No cranial nerve deficit. He exhibits normal muscle tone. Coordination normal.  Skin: Skin is warm and dry. No rash noted. He is not diaphoretic.  Psychiatric: He has a normal mood and affect. His behavior is normal. Judgment and thought content normal.    [facility] reading (PRIMARY) by  Dr. Katrinka Blazing.  R SHOULDER FILMS: NAD; R KNEE FILMS: NAD  PREVNAR-13 ADMINISTERED.    Assessment & Plan:   1. Medicare annual wellness visit, subsequent   2. Obesity (BMI 30.0-34.9)   3. Essential hypertension   4. Gouty arthropathy    5. Dyslipidemia   6. BPH (benign prostatic hyperplasia)   7. Encounter for prostate cancer screening   8. Need for shingles vaccine   9. Need for prophylactic vaccination against Streptococcus pneumoniae (pneumococcus)   10. Colon cancer screening   11. Arthralgia     1.Medicare Annual Wellness Examination: anticipatory guidance --- exercise, weight loss, ASA 81mg  daily.  Refer for colonoscopy. S/p Prevnar 13 in office.  Obtain PSA.  Independent with ADLs.  No evidence of depression. No hearing loss.  Low fall risk.  Has advanced directives.   2.  Obesity: recommend weight loss, exercise, low-caloric food choices. 3.  HTN: controlled; obtain labs; continue current medications. 4. Gout: stable; obtain labs; continue current medications. 5.  Dyslipidemia: stable; obtain labs; continue current medications. 6.  BPH: stable; appointment next month with urology. 7.  Prostate cancer screening: obtain PSA.  Appointment in 03/2015 with urology. 8.  Rx for Zostavax. 9.  S/p Prevnar 13. 10. Colon cancer screening: refer for repeat colonoscopy with [facility]. 11. Arthralgias: Worsening; s/p shoulder films and knee films.  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  Rx for Meloxicam provided due to daily joint pain.   Orders Placed This Encounter  Procedures  . DG Knee Complete 4 Views Right    Standing Status: Future     Number of Occurrences: 1     Standing Expiration Date: 02/28/2016    Order Specific Question:  Reason for Exam (SYMPTOM  OR DIAGNOSIS REQUIRED)    Answer:  R shoulder pain for years    Order Specific Question:  Preferred imaging location?    Answer:  External  . DG Shoulder Right    Standing Status: Future     Number of Occurrences: 1     Standing Expiration Date: 02/28/2016    Order Specific Question:  Reason for Exam (SYMPTOM  OR DIAGNOSIS REQUIRED)    Answer:  R shoulder pain for years    Order Specific Question:   Preferred imaging location?    Answer:  External  . Pneumococcal conjugate vaccine 13-valent IM  . CBC with Differential/Platelet  . Comprehensive metabolic panel    Order Specific Question:  Has the patient fasted?    Answer:  Yes  . Lipid panel    Order Specific Question:  Has the patient fasted?    Answer:  Yes  . PSA, Medicare  . Ambulatory referral to Gastroenterology    Referral  Priority:  Routine    Referral Type:  Consultation    Referral Reason:  Specialty Services Required    Number of Visits Requested:  1  . POCT urinalysis dipstick    Meds ordered this encounter  Medications  . zoster vaccine live, PF, (ZOSTAVAX) [address] UNT/0.65ML injection    Sig: Inject 19,400 Units into the skin once.    Dispense:  1 each    Refill:  0  . lisinopril-hydrochlorothiazide (PRINZIDE,ZESTORETIC) 20-25 MG per tablet    Sig: TAKE 1 TABLET BY MOUTH DAILY.    Dispense:  90 tablet    Refill:  3  . meloxicam (MOBIC) 15 MG tablet    Sig: Take 1 tablet (15 mg total) by mouth daily.    Dispense:  90 tablet    Refill:  3    Return in about 6 months (around 08/31/2015) for recheck high blood pressure, high cholesterol.     Siarra Gilkerson Paulita Fujita, M.D. [facility] [address] [place], [place]  [address] [phone] phone [phone] fax

## 2015-03-01 LAB — PSA, MEDICARE: PSA: 0.47 ng/mL (ref <=4.00)

## 2015-03-11 ENCOUNTER — Encounter: Payer: Self-pay | Admitting: Family Medicine

## 2015-05-17 ENCOUNTER — Telehealth: Payer: Self-pay | Admitting: Family Medicine

## 2015-05-17 NOTE — Telephone Encounter (Signed)
Left a message for patient to return call.  He is no adherant with his lisinopril and pravastatin and we need to see what we can do to help with this situation.

## 2015-06-20 ENCOUNTER — Other Ambulatory Visit: Payer: Self-pay | Admitting: Family Medicine

## 2015-06-22 NOTE — Telephone Encounter (Signed)
Dr Katrinka BlazingSmith, you saw pt in Aug for CPE and are his PCP. At [facility] you instr'd pt to continue his gout meds, but this was not on his med list at the time. We had Rxd it previously. Please advise.

## 2015-06-24 NOTE — Telephone Encounter (Signed)
I refilled Indomethacin for patient.  Please call and advise patient that if he takes Indomethacin for a gout flare, he should NOT take Meloxicam on those days.

## 2015-06-26 NOTE — Telephone Encounter (Signed)
Called pt and left detailed message with wife (on HIPPA), who wrote down inst's from Dr Katrinka BlazingSmith. She will have pt call me if he has any questions.

## 2015-06-28 ENCOUNTER — Telehealth: Payer: Self-pay

## 2015-06-28 NOTE — Telephone Encounter (Signed)
PA completed for indomethacin on covermymeds. Pending. 

## 2015-08-04 ENCOUNTER — Other Ambulatory Visit: Payer: Self-pay | Admitting: Family Medicine

## 2015-08-13 ENCOUNTER — Ambulatory Visit (INDEPENDENT_AMBULATORY_CARE_PROVIDER_SITE_OTHER): Payer: Medicare Other | Admitting: Family Medicine

## 2015-08-13 VITALS — BP 132/88 | HR 105 | Temp 98.8°F | Resp 18 | Ht 71.0 in | Wt 218.6 lb

## 2015-08-13 DIAGNOSIS — N3000 Acute cystitis without hematuria: Secondary | ICD-10-CM | POA: Diagnosis not present

## 2015-08-13 DIAGNOSIS — G5710 Meralgia paresthetica, unspecified lower limb: Secondary | ICD-10-CM | POA: Insufficient documentation

## 2015-08-13 DIAGNOSIS — R300 Dysuria: Secondary | ICD-10-CM

## 2015-08-13 DIAGNOSIS — G5711 Meralgia paresthetica, right lower limb: Secondary | ICD-10-CM | POA: Diagnosis not present

## 2015-08-13 LAB — POC MICROSCOPIC URINALYSIS (UMFC): Mucus: ABSENT

## 2015-08-13 LAB — POCT URINALYSIS DIP (MANUAL ENTRY)
BILIRUBIN UA: NEGATIVE
Bilirubin, UA: NEGATIVE
Glucose, UA: NEGATIVE
Nitrite, UA: NEGATIVE
PH UA: 6
PROTEIN UA: NEGATIVE
RBC UA: NEGATIVE
SPEC GRAV UA: 1.01
Urobilinogen, UA: 0.2

## 2015-08-13 MED ORDER — CIPROFLOXACIN HCL 500 MG PO TABS: 500.0000 mg | ORAL_TABLET | Freq: Two times a day (BID) | ORAL | Status: DC

## 2015-08-13 NOTE — Progress Notes (Addendum)
[facility] [address] [place] [address] [phone]  Date:  08/13/2015   Name:  Clayton Lawson.   DOB:  12/18/1945   MRN:  [id number]  PCP:  Nilda Simmer, MD    Chief Complaint: Generalized Body Aches; Chills; Headache; Dysuria; Back Pain; and Numbness   History of Present Illness:  Clayton Fecher. is a 70 y.o. very pleasant male patient who presents with the following:  Established pt wit history of HTN, obesity, high cholesterol,  Here today with complaint of illness. He is afraid he has another UTI Yesterday he noted onset of HA, lightheadedness, lower back pain and dyrusia.  He has noted frequent urination. He was referred to see urology in the past but did not end up going Last night he was sweating and having chills, but he is not aware of any fever.   No abd pain or vomiting  He had a UTI in May of 2016- this was the first and only UTI he has ever had  He has noticed some tingling in his right leg for about 3 years.  He states that both of his parents had neuropathy.  He does not have any pain, but he does have numbness in the right external thigh.  It will wax and wane, but it is always there to some degree.  It is located in the lateral right thigh.  Does not effect the foot.    Patient Active Problem List   Diagnosis Date Noted  . HTN (hypertension) 05/28/2012  . 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 BPH (benign prostatic hyperplasia) 05/28/2012  . Obesity (BMI 30.0-34.9) 05/28/2012  . Dyslipidemia 05/28/2012    Past Medical History  Diagnosis Date  . Arthritis   . Cataract   . Gout   . Hypertension   . Hyperlipidemia   . BPH (benign prostatic hyperplasia)     Past Surgical History  Procedure Laterality Date  . Elbow surgery    . Knee surgery    . Shoulder surgery    . Eye surgery Bilateral 2013    LENS IMPLANT  . Vasectomy  1980  . Hernia repair  1954 and 1965    Social History  Substance Use Topics  . Smoking  status: Former Smoker    Quit date: 11/09/1974  . Smokeless tobacco: Never Used  . Alcohol Use: 0.0 oz/week    0 Standard drinks or equivalent per week     Comment: SOCIAL    Family History  Problem Relation Age of Onset  . Stroke Mother     few mini strokes/TIAs  . Cerebral palsy Sister     Allergies  Allergen Reactions  . Benadryl [Diphenhydramine Hcl] Anxiety    Medication list has been reviewed and updated.  Current Outpatient Prescriptions on File Prior to Visit  Medication Sig Dispense Refill  . allopurinol (ZYLOPRIM) 300 MG tablet Take 1 tablet (300 mg total) by mouth daily. 90 tablet 0  . fish oil-omega-3 fatty acids 1000 MG capsule Take 2 g by mouth daily.    Marland Kitchen lisinopril-hydrochlorothiazide (PRINZIDE,ZESTORETIC) 20-25 MG per tablet TAKE 1 TABLET BY MOUTH DAILY. 90 tablet 3  . meloxicam (MOBIC) 15 MG tablet Take 1 tablet (15 mg total) by mouth daily. 90 tablet 3  . Multiple Vitamin (MULTIVITAMIN) tablet Take 1 tablet by mouth daily.    . pravastatin (PRAVACHOL) 40 MG tablet Take 1 tablet (40 mg total) by mouth daily. 90 tablet 3  . indomethacin (  INDOCIN) 50 MG capsule TAKE ONE CAPSULE BY MOUTH THREE TIMES DAILY WITH MEALS (Patient not taking: Reported on 08/13/2015) 40 capsule 1   No current facility-administered medications on file prior to visit.    Review of Systems:  [place]  Physical Examination: Filed Vitals:   08/13/15 0834  Temp: 98.8 F (37.1 C)  Resp: 18   Filed Vitals:   08/13/15 0834  Height:  (1.803 m)  Weight: 218 lb 9.6 oz (99.156 kg)   Body mass index is 30.5 kg/(m^2). Ideal Body Weight: Weight in (lb) to have BMI = 25: 178.9  GEN: WDWN, NAD, Non-toxic, A & O x 3, overweight, looks well HEENT: Atraumatic, Normocephalic. Neck supple. No masses, No LAD. Ears and Nose: No external deformity. CV: RRR, No M/G/R. No JVD. No thrill. No extra heart sounds. PULM: CTA B, no wheezes, crackles, rhonchi. No retractions. No resp. distress. No  accessory muscle use. ABD: S, NT, ND. No rebound. No HSM.  abd is benign EXTR: No c/c/e NEURO Normal gait.  PSYCH: Normally interactive. Conversant. Not depressed or anxious appearing.  Calm demeanor.  GU: normal exam- normal testes and penis Normal strength of BLE.  Normal feet with normal hair growth and sensation, pulses   Assessment and Plan: Acute cystitis without hematuria - Plan: ciprofloxacin (CIPRO) 500 MG tablet  Dysuria - Plan: POCT urinalysis dipstick, POCT Microscopic Urinalysis ([facility]), Urine culture  Meralgia paraesthetica, right  Likely recurrent UTI Treat with cipro while we await culture Likely meralgia paresthetica.  At this time he does not wish to pursue any other therapy as it is not really bothersome to him  Signed Abbe Amsterdam, MD  Received urine culture on 1/280- mixed bacteria only.  Called and LMOM- likely contamination.  Let me know if he is not feeling well again  Results for orders placed or performed in visit on 08/13/15  Urine culture  Result Value Ref Range   Colony Count 30,000 COLONIES/ML    Organism ID, Bacteria Multiple bacterial morphotypes present, none    Organism ID, Bacteria predominant. Suggest appropriate recollection if     Organism ID, Bacteria clinically indicated.   POCT urinalysis dipstick  Result Value Ref Range   Color, UA yellow yellow   Clarity, UA hazy (A) clear   Glucose, UA negative negative   Bilirubin, UA negative negative   Ketones, POC UA negative negative   Spec Grav, UA 1.010    Blood, UA negative negative   pH, UA 6.0    Protein Ur, POC negative negative   Urobilinogen, UA 0.2    Nitrite, UA Negative Negative   Leukocytes, UA moderate (2+) (A) Negative  POCT Microscopic Urinalysis ([facility])  Result Value Ref Range   WBC,UR,HPF,POC Many (A) None WBC/hpf   RBC,UR,HPF,POC None None RBC/hpf   Bacteria None None, Too numerous to count   Mucus Absent Absent   Epithelial Cells, UR Per Microscopy Few (A) None,  Too numerous to count cells/hpf

## 2015-08-13 NOTE — Patient Instructions (Addendum)
It does appear that you have another UTI.  I am going to send your urine for a culture and meanwhile we will start cipro.  Please do give the urology office ([facility], across from [facility]) and schedule an appointment as this is your second UTI.   Let me know if you are not better in 1-2 days  I think that the problem with your leg is meralgia paresthetica.  This is not dangerous but can be bothersome. If you do desire further treatment let me know and please read the hand- out I gave you

## 2015-08-14 LAB — URINE CULTURE: Colony Count: 30000

## 2015-08-18 ENCOUNTER — Encounter: Payer: Self-pay | Admitting: Family Medicine

## 2015-08-27 ENCOUNTER — Ambulatory Visit: Payer: Medicare Other | Admitting: Family Medicine

## 2015-08-30 ENCOUNTER — Ambulatory Visit (INDEPENDENT_AMBULATORY_CARE_PROVIDER_SITE_OTHER): Payer: Medicare Other | Admitting: Family Medicine

## 2015-08-30 VITALS — BP 141/92 | HR 80 | Temp 98.5°F | Resp 18 | Ht 69.5 in | Wt 213.8 lb

## 2015-08-30 DIAGNOSIS — I10 Essential (primary) hypertension: Secondary | ICD-10-CM

## 2015-08-30 DIAGNOSIS — F1020 Alcohol dependence, uncomplicated: Secondary | ICD-10-CM | POA: Diagnosis not present

## 2015-08-30 MED ORDER — CHLORDIAZEPOXIDE HCL 25 MG PO CAPS: 25.0000 mg | ORAL_CAPSULE | Freq: Three times a day (TID) | ORAL | Status: DC | PRN

## 2015-08-30 NOTE — Patient Instructions (Signed)
[facility] [address] [place], [place]  [address] [phone]  Take Librium 1-2 tablets three times daily as needed for the next week; wean slowly over the next week.  Suggested dosing: 1 capsule now 2 capsules at bedtime 1 capsule in morning upon awakening, 1 capsule in afternoon tomorrow, 2 capsules tomorrow evening (can take 2 extra if needed) 1 capsule on Saturday morning, 1 capsule on Saturday afternoon, 2 capsule on Saturday night (Can take 1 extra if needed) 1 capsule on Sunday morning, 1 capsule on Sunday afternoon, 2 capsules on Sunday night (Can take 1 extra if needed) 1 capsule on Monday morning, afternoon, night and continue each day until gone.

## 2015-08-30 NOTE — Progress Notes (Signed)
Subjective:    Patient ID: Clayton Kin., male    DOB: 1946/05/02, 70 y.o.   MRN: [id number]  08/30/2015  Personal Problem   HPI This 70 y.o. male presents for evaluation of alcoholism.  Interested in help for alcoholism.  Drinks 6-12 cans 16 ounces per day.  No liquor; no wine.  No DUIs. Did get stopped one time; stopped two years ago.  Duration 6 years.  Does not drink at home; drinks when leaves work; sits in parking lot and drink it.  All of it. Been lucky.  Family fully aware of alcohol intake. Hiding it for 8 years. Daily drinking for 8-10 years.  Has tried AA without benefit.  Son lives in [place], [place].  Wife drank in past; wife just quit eventually for no specific reason.  Wife gets mad.  Minimal affect at this point.  Does it because wants to.  Started drinking [place] 5.5%; has gone to 6.9% now.   Daughter very concerned and will not allow to see grandchildren.  Two granddaughters.  Wife saw father intoxicated severely one time.  Daughter Granddaughter (10, 7). Custody battle right now.  Wife must have athtroscopic surgery on knee soon.  Wife with memory issues.  Daughter lives one mile away. Longstanding drinker since [employer] in 1967.  All beer.  Rare mixed drink.  Parents both drinkers not heavy; has seen father intoxicated in past.  Brother and wife big wine drinkers.  Brother drinks daily; not sure if daily.  Mom no longer drinks at age 107; father will still have a drink.    Goes to work early on Saturday; felt groggy; did not sleep well.  Earliest drinking at 7:30am on days off.  Does not keep beer at home. Married x 45 years.  Gets shaky after drinking due to excess.  Financially good; work is good.  [employer]; loves job.  Gout exacerbations.  Works Monday and Tuesdays; gets off at 3:00pm.  Then Wednesdays and Thursdays around 3:00pm.  On Fridays work 10:00am until 5:00pm; early morning could work.  Saturday 6:30-11:30; Sunday Off.   1976 quit smoking cold [place].  Shared with  daughter that getting help today.     Review of Systems  Constitutional: Negative for fever, chills, diaphoresis, activity change, appetite change and fatigue.  Respiratory: Negative for cough and shortness of breath.   Cardiovascular: Negative for chest pain, palpitations and leg swelling.  Gastrointestinal: Negative for nausea, vomiting, abdominal pain and diarrhea.  Endocrine: Negative for cold intolerance, heat intolerance, polydipsia, polyphagia and polyuria.  Skin: Negative for color change, rash and wound.  Neurological: Negative for dizziness, tremors, seizures, syncope, facial asymmetry, speech difficulty, weakness, light-headedness, numbness and headaches.  Psychiatric/Behavioral: Negative for sleep disturbance and dysphoric mood. The patient is not nervous/anxious.     Past Medical History  Diagnosis Date  . Arthritis   . Cataract   . Gout   . Hypertension   . Hyperlipidemia   . BPH (benign prostatic hyperplasia)    Past Surgical History  Procedure Laterality Date  . Elbow surgery    . Knee surgery    . Shoulder surgery    . Eye surgery Bilateral 2013    LENS IMPLANT  . Vasectomy  1980  . Hernia repair  1954 and 1965   Allergies  Allergen Reactions  . Benadryl [Diphenhydramine Hcl] Anxiety    Social History   Social History  . Marital Status: Married    Spouse Name: N/A  .  Number of Children: N/A  . Years of Education: N/A   Occupational History  . shipping    Social History Main Topics  . Smoking status: Former Smoker    Quit date: 11/09/1974  . Smokeless tobacco: Never Used  . Alcohol Use: 0.0 oz/week    0 Standard drinks or equivalent per week     Comment: SOCIAL  . Drug Use: No  . Sexual Activity: No   Other Topics Concern  . Not on file   Social History Narrative   Marital status: married x 43 years; happily married.      Children:  2 CHILDREN; 6 grandchildren; no gg.      Employment:  Full time Nature conservation officer at [employer].       Tobacco: quit 1976; smoked for 15 years      Alcohol:  2 drinks per day beer or wine      Exercise doing cardio and light weights; job is physical.  Previous marathon runner.      Seatbelt: 100%      Advanced Directives:  Yes; +living will; +FULL CODE but no prolonged measures.        Guns: unloaded and unsecured.      ADLs: independent with all ADLs.     Family History  Problem Relation Age of Onset  . Stroke Mother     few mini strokes/TIAs  . Cerebral palsy Sister        Objective:    BP 141/92 mmHg  Pulse 80  Temp(Src) 98.5 F (36.9 C) (Oral)  Resp 18  Ht 5' 9.5" (1.765 m)  Wt 213 lb 12.8 oz (96.979 kg)  BMI 31.13 kg/m2  SpO2 96% Physical Exam  Constitutional: He is oriented to person, place, and time. He appears well-developed and well-nourished. No distress.  HENT:  Head: Normocephalic and atraumatic.  Right Ear: External ear normal.  Left Ear: External ear normal.  Nose: Nose normal.  Mouth/Throat: Oropharynx is clear and moist.  Eyes: Conjunctivae and EOM are normal. Pupils are equal, round, and reactive to light.  Neck: Normal range of motion. Neck supple. Carotid bruit is not present. No thyromegaly present.  Cardiovascular: Normal rate, regular rhythm, normal heart sounds and intact distal pulses.  Exam reveals no gallop and no friction rub.   No murmur heard. Pulmonary/Chest: Effort normal and breath sounds normal. He has no wheezes. He has no rales.  Abdominal: Soft. Bowel sounds are normal. He exhibits no distension and no mass. There is no tenderness. There is no rebound and no guarding.  Lymphadenopathy:    He has no cervical adenopathy.  Neurological: He is alert and oriented to person, place, and time. No cranial nerve deficit. He exhibits normal muscle tone. Coordination normal.  Skin: Skin is warm and dry. No rash noted. He is not diaphoretic.  Psychiatric: He has a normal mood and affect. His behavior is normal. Judgment and thought content normal.    Nursing note and vitals reviewed.       Assessment & Plan:   1. Alcoholism (HCC)   2. Essential hypertension, benign    -New; desires cessation/sobriety. -30 minutes of face-to-face counseling provided during visit with 50% of time dedicated to counseling and coordination of care. -rx for Librium taper provided. -information for [facility] provided; pt to contact tomorrow. -Close follow-up; s/s of withdrawal reviewed.   No orders of the defined types were placed in this encounter.   Meds ordered this encounter  Medications  . chlordiazePOXIDE (  LIBRIUM) 25 MG capsule    Sig: Take 1-2 capsules (25-50 mg total) by mouth 3 (three) times daily as needed for anxiety.    Dispense:  50 capsule    Refill:  0    No Follow-up on file.    Alessia Gonsalez Paulita Fujita, M.D. [facility] [address] [place], [place]  [address] [phone] phone [phone] fax

## 2015-08-31 ENCOUNTER — Ambulatory Visit (INDEPENDENT_AMBULATORY_CARE_PROVIDER_SITE_OTHER): Payer: Medicare Other | Admitting: Family Medicine

## 2015-08-31 VITALS — BP 114/80 | HR 103 | Temp 97.9°F | Resp 20 | Ht 70.0 in | Wt 215.2 lb

## 2015-08-31 DIAGNOSIS — F1020 Alcohol dependence, uncomplicated: Secondary | ICD-10-CM | POA: Diagnosis not present

## 2015-08-31 DIAGNOSIS — I10 Essential (primary) hypertension: Secondary | ICD-10-CM | POA: Diagnosis not present

## 2015-08-31 NOTE — Patient Instructions (Signed)
Alcohol Withdrawal  Alcohol withdrawal is a group of symptoms that can develop when a person who drinks heavily and regularly stops drinking or drinks less.  CAUSES  Heavy and regular drinking can cause chemicals that send signals from the brain to the body (neurotransmitters) to deactivate. Alcohol withdrawal develops when deactivated neurotransmitters reactivate because a person stops drinking or drinks less.  RISK FACTORS  The more a person drinks and the longer he or she drinks, the greater the risk of alcohol withdrawal. Severe withdrawal is more likely to develop in someone who:  · Had severe alcohol withdrawal in the past.  · Had a seizure during a previous episode of alcohol withdrawal.  · Is elderly.  · Is pregnant.  · Has been abusing drugs.  · Has other medical problems, including:    Infection.    Heart, lung, or liver disease.    Seizures.    Mental health problems.  SYMPTOMS  Symptoms of this condition can be mild to moderate, or they can be severe.  Mild to moderate symptoms may include:  · Fatigue.  · Nightmares.  · Trouble sleeping.  · Depression.  · Anxiety.  · Inability to think clearly.  · Mood swings.  · Irritability.  · Loss of appetite.  · Nausea or vomiting.  · Clammy skin.  · Extreme sweating.  · Rapid heartbeat.  · Shakiness.  · Uncontrollable shaking (tremor).  Severe symptoms may include:  · Fever.  · Seizures.  · Severe confusion.  · Feeling or seeing things that are not there (hallucinations).  Symptoms usually begin within eight hours after a person stops drinking or drinks less. They can last for weeks.  DIAGNOSIS  Alcohol withdrawal is diagnosed with a medical history and physical exam. Sometimes, urine and blood tests are also done.  TREATMENT  Treatment may involve:  · Monitoring blood pressure, pulse, and breathing.  · Getting fluids through an IV tube.  · Medicine to reduce anxiety.  · Medicine to prevent or control seizures.  · Multivitamins and B vitamins.  · Having a health  care provider check on you daily.  If symptoms are moderate to severe or if there is a risk of severe withdrawal, treatment may be done at a hospital or treatment center.  HOME CARE INSTRUCTIONS  · Take medicines and vitamin supplements only as directed by your health care provider.  · Do not drink alcohol.  · Have someone stay with you or be available if you need help.  · Drink enough fluid to keep your urine clear or pale yellow.  · Consider joining a 12-step program or another alcohol support group.  SEEK MEDICAL CARE IF:  · Your symptoms get worse or do not go away.  · You cannot keep food or water in your stomach.  · You are struggling with not drinking alcohol.  · You cannot stop drinking alcohol.  SEEK IMMEDIATE MEDICAL CARE IF:   · You have an irregular heartbeat.  · You have chest pain.  · You have trouble breathing.  · You have symptoms of severe withdrawal, such as:    A fever.    Seizures.    Severe confusion.    Hallucinations.     This information is not intended to replace advice given to you by your health care provider. Make sure you discuss any questions you have with your health care provider.     Document Released: 04/16/2005 Document Revised: 07/28/2014 Document Reviewed: 04/25/2014  Elsevier Interactive   Patient Education ©2016 Elsevier Inc.

## 2015-08-31 NOTE — Progress Notes (Signed)
Subjective:    Patient ID: Clayton Kin., male    DOB: 10-Jun-1946, 70 y.o.   MRN: [id number]  08/31/2015  Follow-up   HPI This 70 y.o. male presents for 24 hour follow-up of alcoholism with Librium taper.  Did well overnight; no alcohol intake after visit yesterday. Tolerating Librium without side effects.  Denies confusion, fever, tachycardia, irritability, depression, shaking.  Wife has not noticed any unusual behavior.   [facility] eight week program 6:30-9:00pm M-W-F.  Mondya and Wednesday are with group therapy.  Friday is single therapy with Fridays.  Will arrive at 5:00pm on Monday for paperwork and see Brett [facility].  Stay and start group session.    Stayed really busy this morning; awoke this morning and took Librium; repeated Librium at lunch; will take again tonight.  Went in at 7:30am this morning; slept well last night; felt good this morning.    Review of Systems  Constitutional: Negative for fever, chills, diaphoresis, activity change, appetite change and fatigue.  Respiratory: Negative for cough and shortness of breath.   Cardiovascular: Negative for chest pain, palpitations and leg swelling.  Gastrointestinal: Negative for nausea, vomiting, abdominal pain and diarrhea.  Endocrine: Negative for cold intolerance, heat intolerance, polydipsia, polyphagia and polyuria.  Skin: Negative for color change, rash and wound.  Neurological: Negative for dizziness, tremors, seizures, syncope, facial asymmetry, speech difficulty, weakness, light-headedness, numbness and headaches.  Psychiatric/Behavioral: Negative for suicidal ideas, hallucinations, behavioral problems, confusion, sleep disturbance, self-injury, dysphoric mood, decreased concentration and agitation. The patient is not nervous/anxious.     Past Medical History  Diagnosis Date  . Arthritis   . Cataract   . Gout   . Hypertension   . Hyperlipidemia   . BPH (benign prostatic hyperplasia)    Past Surgical  History  Procedure Laterality Date  . Elbow surgery    . Knee surgery    . Shoulder surgery    . Eye surgery Bilateral 2013    LENS IMPLANT  . Vasectomy  1980  . Hernia repair  1954 and 1965   Allergies  Allergen Reactions  . Benadryl [Diphenhydramine Hcl] Anxiety    Social History   Social History  . Marital Status: Married    Spouse Name: N/A  . Number of Children: N/A  . Years of Education: N/A   Occupational History  . shipping    Social History Main Topics  . Smoking status: Former Smoker    Quit date: 11/09/1974  . Smokeless tobacco: Never Used  . Alcohol Use: 0.0 oz/week    0 Standard drinks or equivalent per week     Comment: SOCIAL  . Drug Use: No  . Sexual Activity: No   Other Topics Concern  . Not on file   Social History Narrative   Marital status: married x 43 years; happily married.      Children:  2 CHILDREN; 6 grandchildren; no gg.      Employment:  Full time Nature conservation officer at [employer].      Tobacco: quit 1976; smoked for 15 years      Alcohol:  2 drinks per day beer or wine      Exercise doing cardio and light weights; job is physical.  Previous marathon runner.      Seatbelt: 100%      Advanced Directives:  Yes; +living will; +FULL CODE but no prolonged measures.        Guns: unloaded and unsecured.      ADLs: independent  with all ADLs.     Family History  Problem Relation Age of Onset  . Stroke Mother     few mini strokes/TIAs  . Cerebral palsy Sister        Objective:    BP 114/80 mmHg  Pulse 103  Temp(Src) 97.9 F (36.6 C) (Oral)  Resp 20  Ht  (1.778 m)  Wt 215 lb 3.2 oz (97.614 kg)  BMI 30.88 kg/m2  SpO2 97% Physical Exam  Constitutional: He is oriented to person, place, and time. He appears well-developed and well-nourished. No distress.  HENT:  Head: Normocephalic and atraumatic.  Right Ear: External ear normal.  Left Ear: External ear normal.  Nose: Nose normal.  Mouth/Throat: Oropharynx is clear and moist.    Eyes: Conjunctivae and EOM are normal. Pupils are equal, round, and reactive to light.  Neck: Normal range of motion. Neck supple. Carotid bruit is not present. No thyromegaly present.  Cardiovascular: Normal rate, regular rhythm, normal heart sounds and intact distal pulses.  Exam reveals no gallop and no friction rub.   No murmur heard. Pulmonary/Chest: Effort normal and breath sounds normal. He has no wheezes. He has no rales.  Abdominal: Soft. Bowel sounds are normal. He exhibits no distension and no mass. There is no tenderness. There is no rebound and no guarding.  Lymphadenopathy:    He has no cervical adenopathy.  Neurological: He is alert and oriented to person, place, and time. He has normal strength. He displays no tremor. No cranial nerve deficit or sensory deficit. He exhibits normal muscle tone. Coordination normal.  Skin: Skin is warm and dry. No rash noted. He is not diaphoretic.  Psychiatric: He has a normal mood and affect. His behavior is normal. Judgment and thought content normal.  Nursing note and vitals reviewed.       Assessment & Plan:   1. Alcoholism (HCC)   2. Essential hypertension    -Stable on Librium taper; 24-48 hours without alcohol intake. -coping well physically and emotionally. -has established with [facility] for intensive outpatient program. -RTC 72 hours or sooner if worse.   No orders of the defined types were placed in this encounter.   No orders of the defined types were placed in this encounter.    No Follow-up on file.    Yajaira Doffing Paulita Fujita, M.D. [facility] [address] [place], [place]  [address] [phone] phone [phone] fax

## 2015-09-02 ENCOUNTER — Ambulatory Visit (INDEPENDENT_AMBULATORY_CARE_PROVIDER_SITE_OTHER): Payer: Medicare Other | Admitting: Family Medicine

## 2015-09-02 VITALS — BP 130/80 | HR 72 | Temp 97.8°F | Resp 18 | Ht 70.25 in | Wt 216.4 lb

## 2015-09-02 DIAGNOSIS — I10 Essential (primary) hypertension: Secondary | ICD-10-CM

## 2015-09-02 DIAGNOSIS — F1020 Alcohol dependence, uncomplicated: Secondary | ICD-10-CM | POA: Diagnosis not present

## 2015-09-02 NOTE — Progress Notes (Signed)
Subjective:    Patient ID: Clayton Kin., male    DOB: March 23, 1946, 70 y.o.   MRN: [id number]  09/02/2015  Follow-up   HPI This 70 y.o. male presents for evaluation of alcoholism.  Doing well.  Worked 6:30am-12:00pm.  Going to get busier. No financial need.  Workaholic.   Went straigth home; called wife and told her going home.  Did not go to daughter's house.  Later yesterday, wife and pt wen tot [place] and got something to eat.   Knew would get sick.   Librium taper --- 1 pill this morning; 1 this afternoon. Took 2 last night.   Starts at [facility] tomorrow night.     Review of Systems  Constitutional: Negative for fever, chills, diaphoresis, activity change, appetite change and fatigue.  Respiratory: Negative for cough and shortness of breath.   Cardiovascular: Negative for chest pain, palpitations and leg swelling.  Gastrointestinal: Negative for nausea, vomiting, abdominal pain and diarrhea.  Endocrine: Negative for cold intolerance, heat intolerance, polydipsia, polyphagia and polyuria.  Skin: Negative for color change, rash and wound.  Neurological: Negative for dizziness, tremors, seizures, syncope, facial asymmetry, speech difficulty, weakness, light-headedness, numbness and headaches.  Psychiatric/Behavioral: Negative for sleep disturbance and dysphoric mood. The patient is not nervous/anxious.     Past Medical History  Diagnosis Date  . Arthritis   . Cataract   . Gout   . Hypertension   . Hyperlipidemia   . BPH (benign prostatic hyperplasia)    Past Surgical History  Procedure Laterality Date  . Elbow surgery    . Knee surgery    . Shoulder surgery    . Eye surgery Bilateral 2013    LENS IMPLANT  . Vasectomy  1980  . Hernia repair  1954 and 1965   Allergies  Allergen Reactions  . Benadryl [Diphenhydramine Hcl] Anxiety    Social History   Social History  . Marital Status: Married    Spouse Name: N/A  . Number of Children: N/A  . 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 Smoking status: Former Smoker    Quit date: 11/09/1974  . Smokeless tobacco: Never Used  . Alcohol Use: 0.0 oz/week    0 Standard drinks or equivalent per week     Comment: SOCIAL  . Drug Use: No  . Sexual Activity: No   Other Topics Concern  . Not on file   Social History Narrative   Marital status: married x 43 years; happily married.      Children:  2 CHILDREN; 6 grandchildren; no gg.      Employment:  Full time Nature conservation officer at [employer].      Tobacco: quit 1976; smoked for 15 years      Alcohol:  2 drinks per day beer or wine      Exercise doing cardio and light weights; job is physical.  Previous marathon runner.      Seatbelt: 100%      Advanced Directives:  Yes; +living will; +FULL CODE but no prolonged measures.        Guns: unloaded and unsecured.      ADLs: independent with all ADLs.     Family History  Problem Relation Age of Onset  . Stroke Mother     few mini strokes/TIAs  . Cerebral palsy Sister        Objective:    BP 130/80 mmHg  Pulse 72  Temp(Src) 97.8 F (36.6 C) (Oral)  Resp 18  Ht 5' 10.25" (1.784 m)  Wt 216 lb 6.4 oz (98.158 kg)  BMI 30.84 kg/m2  SpO2 97% Physical Exam  Constitutional: He is oriented to person, place, and time. He appears well-developed and well-nourished. No distress.  HENT:  Head: Normocephalic and atraumatic.  Eyes: Conjunctivae and EOM are normal. Pupils are equal, round, and reactive to light.  Neck: Normal range of motion. Neck supple. Carotid bruit is not present. No thyromegaly present.  Cardiovascular: Normal rate, regular rhythm, normal heart sounds and intact distal pulses.  Exam reveals no gallop and no friction rub.   No murmur heard. Pulmonary/Chest: Effort normal and breath sounds normal. He has no wheezes. He has no rales.  Lymphadenopathy:    He has no cervical adenopathy.  Neurological: He is alert and oriented to  person, place, and time. No cranial nerve deficit.  Skin: Skin is warm and dry. No rash noted. He is not diaphoretic.  Psychiatric: He has a normal mood and affect. His behavior is normal.  Nursing note and vitals reviewed.       Assessment & Plan:   1. Essential hypertension   2. Alcoholism (HCC)    -stable. -doing well with Librium taper. -no active signs of withdrawal. -[facility] for outpatient management.   No orders of the defined types were placed in this encounter.   No orders of the defined types were placed in this encounter.    No Follow-up on file.    Ikenna Ohms Paulita Fujita, M.D. [facility] [address] [place], [place]  [address] [phone] phone [phone] fax

## 2015-09-05 ENCOUNTER — Encounter: Payer: Self-pay | Admitting: Family Medicine

## 2015-09-05 ENCOUNTER — Ambulatory Visit (INDEPENDENT_AMBULATORY_CARE_PROVIDER_SITE_OTHER): Payer: Medicare Other | Admitting: Family Medicine

## 2015-09-05 VITALS — BP 118/72 | HR 89 | Temp 98.0°F | Resp 16 | Wt 214.4 lb

## 2015-09-05 DIAGNOSIS — E785 Hyperlipidemia, unspecified: Secondary | ICD-10-CM

## 2015-09-05 DIAGNOSIS — M109 Gout, unspecified: Secondary | ICD-10-CM

## 2015-09-05 DIAGNOSIS — R05 Cough: Secondary | ICD-10-CM | POA: Diagnosis not present

## 2015-09-05 DIAGNOSIS — J0101 Acute recurrent maxillary sinusitis: Secondary | ICD-10-CM

## 2015-09-05 DIAGNOSIS — F1020 Alcohol dependence, uncomplicated: Secondary | ICD-10-CM

## 2015-09-05 DIAGNOSIS — M1009 Idiopathic gout, multiple sites: Secondary | ICD-10-CM

## 2015-09-05 DIAGNOSIS — R059 Cough, unspecified: Secondary | ICD-10-CM

## 2015-09-05 DIAGNOSIS — R739 Hyperglycemia, unspecified: Secondary | ICD-10-CM

## 2015-09-05 DIAGNOSIS — I10 Essential (primary) hypertension: Secondary | ICD-10-CM

## 2015-09-05 LAB — CBC WITH DIFFERENTIAL/PLATELET
BASOS ABS: 0 10*3/uL (ref 0.0–0.1)
Basophils Relative: 0 % (ref 0–1)
Eosinophils Absolute: 0.2 10*3/uL (ref 0.0–0.7)
Eosinophils Relative: 2 % (ref 0–5)
HEMATOCRIT: 40.7 % (ref 39.0–52.0)
HEMOGLOBIN: 13.9 g/dL (ref 13.0–17.0)
LYMPHS PCT: 15 % (ref 12–46)
Lymphs Abs: 1.1 10*3/uL (ref 0.7–4.0)
MCH: 30.5 pg (ref 26.0–34.0)
MCHC: 34.2 g/dL (ref 30.0–36.0)
MCV: 89.3 fL (ref 78.0–100.0)
MPV: 10 fL (ref 8.6–12.4)
Monocytes Absolute: 0.8 10*3/uL (ref 0.1–1.0)
Monocytes Relative: 10 % (ref 3–12)
NEUTROS ABS: 5.5 10*3/uL (ref 1.7–7.7)
Neutrophils Relative %: 73 % (ref 43–77)
Platelets: 234 10*3/uL (ref 150–400)
RBC: 4.56 MIL/uL (ref 4.22–5.81)
RDW: 13.7 % (ref 11.5–15.5)
WBC: 7.6 10*3/uL (ref 4.0–10.5)

## 2015-09-05 LAB — COMPREHENSIVE METABOLIC PANEL
ALBUMIN: 3.8 g/dL (ref 3.6–5.1)
ALT: 34 U/L (ref 9–46)
AST: 30 U/L (ref 10–35)
Alkaline Phosphatase: 65 U/L (ref 40–115)
BUN: 12 mg/dL (ref 7–25)
CHLORIDE: 100 mmol/L (ref 98–110)
CO2: 25 mmol/L (ref 20–31)
Calcium: 8.5 mg/dL — ABNORMAL LOW (ref 8.6–10.3)
Creat: 0.95 mg/dL (ref 0.70–1.25)
Glucose, Bld: 107 mg/dL — ABNORMAL HIGH (ref 65–99)
POTASSIUM: 3.7 mmol/L (ref 3.5–5.3)
Sodium: 136 mmol/L (ref 135–146)
TOTAL PROTEIN: 6.8 g/dL (ref 6.1–8.1)
Total Bilirubin: 0.6 mg/dL (ref 0.2–1.2)

## 2015-09-05 LAB — LIPID PANEL
CHOL/HDL RATIO: 3.7 ratio (ref <=5.0)
CHOLESTEROL: 139 mg/dL (ref 125–200)
HDL: 38 mg/dL — ABNORMAL LOW (ref >=40)
LDL Cholesterol: 86 mg/dL (ref <130)
TRIGLYCERIDES: 77 mg/dL (ref <150)
VLDL: 15 mg/dL (ref <30)

## 2015-09-05 LAB — HEMOGLOBIN A1C
Hgb A1c MFr Bld: 5.4 % (ref <5.7)
Mean Plasma Glucose: 108 mg/dL (ref <117)

## 2015-09-05 LAB — POCT INFLUENZA A/B
Influenza A, POC: NEGATIVE
Influenza B, POC: NEGATIVE

## 2015-09-05 MED ORDER — CHLORDIAZEPOXIDE HCL 10 MG PO CAPS: 10.0000 mg | ORAL_CAPSULE | Freq: Two times a day (BID) | ORAL | Status: DC

## 2015-09-05 MED ORDER — AMOXICILLIN-POT CLAVULANATE 875-125 MG PO TABS: 1.0000 | ORAL_TABLET | Freq: Two times a day (BID) | ORAL | Status: DC

## 2015-09-05 NOTE — Progress Notes (Signed)
Subjective:    Patient ID: Clayton Lawson., male    DOB: 10-28-1945, 70 y.o.   MRN: [id number]  09/05/2015  Hyperlipidemia; Hypertension; and check kidney fctn   HPI This 70 y.o. male presents for six month follow-up:  1.  Cold:  Onset yesterday.  No fever; no chills/sweats.  +body aches onset two days ago.  Mild headache.  No ear pain or sore throat.  N orhinorrhea; +nasal congestion. +coughing a lot; no SOB; no sputum. No wheezing.  No n/v/d.  Took Mucinex OTC.    2.  HTN: Patient reports good compliance with medication, good tolerance to medication, and good symptom control.  Home BP 125/80s.  Checks once dialy.  3.   Hyperlipidemia:  Patient reports good compliance with medication, good tolerance to medication, and good symptom control.    4.  OA:  Takes Mobic 14m daily.  Shoulders B.  5.  Gout:  Patient reports good compliance with medication, good tolerance to medication, and good symptom control.  Last attack 3 years ago.    6. Alcoholism:  No drinking since Wednesday;seven days without a drink.  Cannot recall how long it has been since not drinking for a week.   [facility] session.  Everyone very open.  Fine with group session.  Reviewed AA book and took turns reading 12 step program.  Goes back to night for group session. Took Librium 1 this morning, 1 middle of day, 1 last night. Decreased reaction time with 224mof Librium. Started getting really sleepy and more staggering on Sunday afternoon after visit.  Really sleepy now during the day.  No confusion; thoughts are clear. Mostly drowsy.     Review of Systems  Constitutional: Negative for fever, chills, diaphoresis, activity change, appetite change and fatigue.  Respiratory: Negative for cough and shortness of breath.   Cardiovascular: Negative for chest pain, palpitations and leg swelling.  Gastrointestinal: Negative for nausea, vomiting, abdominal pain and diarrhea.    Endocrine: Negative for cold intolerance, heat intolerance, polydipsia, polyphagia and polyuria.  Skin: Negative for color change, rash and wound.  Neurological: Negative for dizziness, tremors, seizures, syncope, facial asymmetry, speech difficulty, weakness, light-headedness, numbness and headaches.  Psychiatric/Behavioral: Negative for sleep disturbance and dysphoric mood. The patient is not nervous/anxious.     Past Medical History  Diagnosis Date  . Arthritis   . Cataract   . Gout   . Hypertension   . Hyperlipidemia   . BPH (benign prostatic hyperplasia)    Past Surgical History  Procedure Laterality Date  . Elbow surgery    . Knee surgery    . Shoulder surgery    . Eye surgery Bilateral 2013    LENS IMPLANT  . Vasectomy  1980  . Hernia repair  1954 and 1965   Allergies  Allergen Reactions  . Benadryl [Diphenhydramine Hcl] Anxiety    Social History   Social History  . Marital Status: Married    Spouse Name: N/A  . Number of Children: N/A  . Years of Education: N/A   Occupational History  . shipping    Social History Main Topics  . Smoking status: Former Smoker    Quit date: 11/09/1974  . Smokeless tobacco: Never Used  . Alcohol Use: 0.0 oz/week    0 Standard drinks or equivalent per week     Comment: SOCIAL  . Drug Use: No  . Sexual Activity: No  Other Topics Concern  . Not on file   Social History Narrative   Marital status: married x 55 years; happily married.      Children:  2 CHILDREN; 6 grandchildren; no gg.      Employment:  Full time Clinical research associate at [employer].      Tobacco: quit 1976; smoked for 15 years      Alcohol:  2 drinks per day beer or wine      Exercise doing cardio and light weights; job is physical.  Previous marathon runner.      Seatbelt: 100%      Advanced Directives:  Yes; +living will; +FULL CODE but no prolonged measures.        Guns: unloaded and unsecured.      ADLs: independent with all ADLs.     Family History   Problem Relation Age of Onset  . Stroke Mother     few mini strokes/TIAs  . Cerebral palsy Sister        Objective:    BP 118/72 mmHg  Pulse 89  Temp(Src) 98 F (36.7 C) (Oral)  Resp 16  Wt 214 lb 6.4 oz (97.251 kg)  SpO2 98% Physical Exam  Constitutional: He is oriented to person, place, and time. He appears well-developed and well-nourished. No distress.  HENT:  Head: Normocephalic and atraumatic.  Right Ear: External ear normal.  Left Ear: External ear normal.  Nose: Nose normal.  Mouth/Throat: Oropharynx is clear and moist.  Eyes: Conjunctivae and EOM are normal. Pupils are equal, round, and reactive to light.  Neck: Normal range of motion. Neck supple. Carotid bruit is not present. No thyromegaly present.  Cardiovascular: Normal rate, regular rhythm, normal heart sounds and intact distal pulses.  Exam reveals no gallop and no friction rub.   No murmur heard. Pulmonary/Chest: Effort normal and breath sounds normal. He has no wheezes. He has no rales.  Abdominal: Soft. Bowel sounds are normal. He exhibits no distension and no mass. There is no tenderness. There is no rebound and no guarding.  Lymphadenopathy:    He has no cervical adenopathy.  Neurological: He is alert and oriented to person, place, and time. No cranial nerve deficit.  Skin: Skin is warm and dry. No rash noted. He is not diaphoretic.  Psychiatric: He has a normal mood and affect. His behavior is normal.  Nursing note and vitals reviewed.       Assessment & Plan:   1. Essential hypertension   2. Dyslipidemia   3. Gouty arthropathy   4. Cough   5. Hyperglycemia   6. Alcoholism ([place])   7. Acute recurrent maxillary sinusitis    -decrease Librium to 34m due to oversedation. -rx for Augmentin for sinusitis. -obtain labs. Continue chronic medications.   Orders Placed This Encounter  Procedures  . CBC with Differential/Platelet  . Comprehensive metabolic panel    Order Specific Question:  Has  the patient fasted?    Answer:  Yes  . Lipid panel    Order Specific Question:  Has the patient fasted?    Answer:  Yes  . Hemoglobin A1c  . POCT Influenza A/B   Meds ordered this encounter  Medications  . chlordiazePOXIDE (LIBRIUM) 10 MG capsule    Sig: Take 1 capsule (10 mg total) by mouth 2 (two) times daily. Take one every morning and one every afternoon for one week and then decrease to one every morning for one week and then stop    Dispense:  21 capsule    Refill:  0  . DISCONTD: amoxicillin-clavulanate (AUGMENTIN) 875-125 MG tablet    Sig: Take 1 tablet by mouth 2 (two) times daily.    Dispense:  20 tablet    Refill:  0    Return in about 2 weeks (around 09/19/2015) for recheck.    Zelina Jimerson Elayne Guerin, M.D. [facility] [address] [place], [place]  [address] [phone] phone [phone] fax

## 2015-09-05 NOTE — Patient Instructions (Addendum)
1.  DECREASE LIBRIUM TO  ONE TABLET EVERY MORNING AND ONE TABLET EVERY AFTERNOON FOR ONE WEEK AND THEN DECREASE TO ONE EVERY EVENING FOR ONE WEEK AND THEN STOP.  2.  STOP LIBRIUM  AT BEDTIME TODAY. 3.  TAKE AUGMENTIN TWICE DAILY FOR TEN DAYS. 4.  TAKE MUCINEX AS DIRECTED. 5.  START NASACORT DAILY.

## 2015-09-11 ENCOUNTER — Telehealth: Payer: Self-pay | Admitting: Family Medicine

## 2015-09-11 NOTE — Telephone Encounter (Signed)
Left a message for patient to return call to schedule colonoscopy.  He had his last one in 2006 with Dr. Kinnie Scales.

## 2015-09-16 ENCOUNTER — Encounter: Payer: Self-pay | Admitting: Family Medicine

## 2015-09-25 ENCOUNTER — Ambulatory Visit (INDEPENDENT_AMBULATORY_CARE_PROVIDER_SITE_OTHER): Payer: Medicare Other | Admitting: Family Medicine

## 2015-09-25 ENCOUNTER — Encounter: Payer: Self-pay | Admitting: Family Medicine

## 2015-09-25 VITALS — BP 127/85 | HR 75 | Temp 98.0°F | Resp 16 | Ht 70.0 in | Wt 217.0 lb

## 2015-09-25 DIAGNOSIS — M25512 Pain in left shoulder: Secondary | ICD-10-CM | POA: Diagnosis not present

## 2015-09-25 DIAGNOSIS — M25562 Pain in left knee: Secondary | ICD-10-CM

## 2015-09-25 DIAGNOSIS — M25511 Pain in right shoulder: Secondary | ICD-10-CM

## 2015-09-25 DIAGNOSIS — F1020 Alcohol dependence, uncomplicated: Secondary | ICD-10-CM

## 2015-09-25 DIAGNOSIS — E785 Hyperlipidemia, unspecified: Secondary | ICD-10-CM | POA: Diagnosis not present

## 2015-09-25 DIAGNOSIS — M25551 Pain in right hip: Secondary | ICD-10-CM | POA: Diagnosis not present

## 2015-09-25 DIAGNOSIS — R768 Other specified abnormal immunological findings in serum: Secondary | ICD-10-CM | POA: Diagnosis not present

## 2015-09-25 DIAGNOSIS — M25561 Pain in right knee: Secondary | ICD-10-CM

## 2015-09-25 DIAGNOSIS — I10 Essential (primary) hypertension: Secondary | ICD-10-CM | POA: Diagnosis not present

## 2015-09-25 DIAGNOSIS — M2550 Pain in unspecified joint: Secondary | ICD-10-CM

## 2015-09-25 MED ORDER — DICLOFENAC SODIUM 75 MG PO TBEC: 75.0000 mg | DELAYED_RELEASE_TABLET | Freq: Two times a day (BID) | ORAL | Status: AC

## 2015-09-25 NOTE — Progress Notes (Signed)
Subjective:    Patient ID: Clayton Kin., male    DOB: 02-17-1946, 70 y.o.   MRN: [id number]  09/25/2015  Follow-up and Medication Management   HPI This 70 y.o. male presents for evaluation of alcoholism.  No drinkiing in three months. On Thursday nights, you can bring a spouse or friend; son-in-law is going to go with patient.  Son-in-law is in recovery.  Had tried before in the past with AA; tried to quit on own and did not work.  No medication Librium three days ago.  No issues.  [employer] [employer] M-W-F; also attending Thursdays with son-in-law.    Knee pain, shoulder pain B:  Meloxicam not working at all.  Orthopedist was [facility].  [facility]; last appointment several years ago.  B hip pain.  If sits too long, hurting all over.  Some lower back pain.   R knee films 02/2015: mild degenerative disease. Walking on concrete all the time.   R shoulder films: no arthritis    Review of Systems  Constitutional: Negative for fever, chills, diaphoresis, activity change, appetite change and fatigue.  Respiratory: Negative for cough and shortness of breath.   Cardiovascular: Negative for chest pain, palpitations and leg swelling.  Gastrointestinal: Negative for nausea, vomiting, abdominal pain and diarrhea.  Endocrine: Negative for cold intolerance, heat intolerance, polydipsia, polyphagia and polyuria.  Skin: Negative for color change, rash and wound.  Neurological: Negative for dizziness, tremors, seizures, syncope, facial asymmetry, speech difficulty, weakness, light-headedness, numbness and headaches.  Psychiatric/Behavioral: Negative for sleep disturbance and dysphoric mood. The patient is not nervous/anxious.     Past Medical History  Diagnosis Date  . Arthritis   . Cataract   . Gout   . Hypertension   . Hyperlipidemia   . BPH (benign prostatic hyperplasia)    Past Surgical History  Procedure Laterality Date  . Elbow surgery    . Knee surgery    . Shoulder  surgery    . Eye surgery Bilateral 2013    LENS IMPLANT  . Vasectomy  1980  . Hernia repair  1954 and 1965   Allergies  Allergen Reactions  . Benadryl [Diphenhydramine Hcl] Anxiety    Social History   Social History  . Marital Status: Married    Spouse Name: N/A  . Number of Children: N/A  . Years of Education: N/A   Occupational History  . shipping    Social History Main Topics  . Smoking status: Former Smoker    Quit date: 11/09/1974  . Smokeless tobacco: Never Used  . Alcohol Use: 0.0 oz/week    0 Standard drinks or equivalent per week     Comment: SOCIAL  . Drug Use: No  . Sexual Activity: No   Other Topics Concern  . Not on file   Social History Narrative   Marital status: married x 43 years; happily married.      Children:  2 CHILDREN; 6 grandchildren; no gg.      Employment:  Full time Nature conservation officer at [employer].      Tobacco: quit 1976; smoked for 15 years      Alcohol:  2 drinks per day beer or wine      Exercise doing cardio and light weights; job is physical.  Previous marathon runner.      Seatbelt: 100%      Advanced Directives:  Yes; +living will; +FULL CODE but no prolonged measures.        Guns: unloaded and  unsecured.      ADLs: independent with all ADLs.     Family History  Problem Relation Age of Onset  . Stroke Mother     few mini strokes/TIAs  . Cerebral palsy Sister        Objective:    BP 127/85 mmHg  Pulse 75  Temp(Src) 98 F (36.7 C)  Resp 16  Ht 5\' 10"  (1.778 m)  Wt 217 lb (98.431 kg)  BMI 31.14 kg/m2 Physical Exam  Constitutional: He is oriented to person, place, and time. He appears well-developed and well-nourished. No distress.  HENT:  Head: Normocephalic and atraumatic.  Right Ear: External ear normal.  Left Ear: External ear normal.  Nose: Nose normal.  Mouth/Throat: Oropharynx is clear and moist.  Eyes: Conjunctivae and EOM are normal. Pupils are equal, round, and reactive to light.  Neck: Normal range of  motion. Neck supple. Carotid bruit is not present. No thyromegaly present.  Cardiovascular: Normal rate, regular rhythm, normal heart sounds and intact distal pulses.  Exam reveals no gallop and no friction rub.   No murmur heard. Pulmonary/Chest: Effort normal and breath sounds normal. He has no wheezes. He has no rales.  Abdominal: Soft. Bowel sounds are normal. He exhibits no distension and no mass. There is no tenderness. There is no rebound and no guarding.  Lymphadenopathy:    He has no cervical adenopathy.  Neurological: He is alert and oriented to person, place, and time. No cranial nerve deficit.  Skin: Skin is warm and dry. No rash noted. He is not diaphoretic.  Psychiatric: He has a normal mood and affect. His behavior is normal.  Nursing note and vitals reviewed.       Assessment & Plan:   1. Essential hypertension   2. Dyslipidemia   3. Alcoholism ([facility])   4. Pain of both shoulder joints   5. Hip pain, right   6. Arthralgia of both knees   7. Arthralgia     Orders Placed This Encounter  Procedures  . Sedimentation rate  . ANA  . Rheumatoid factor  . CK  . Anti-nuclear ab-titer (ANA titer)  . Ambulatory referral to Orthopedic Surgery    Referral Priority:  Routine    Referral Type:  Surgical    Referral Reason:  Specialty Services Required    Requested Specialty:  Orthopedic Surgery    Number of Visits Requested:  1   Meds ordered this encounter  Medications  . diclofenac (VOLTAREN) 75 MG EC tablet    Sig: Take 1 tablet (75 mg total) by mouth 2 (two) times daily.    Dispense:  60 tablet    Refill:  0    Return in about 6 months (around 03/27/2016) for complete physical examiniation.    Shyana Kulakowski Paulita FujitaMartin Imari Sivertsen, M.D. [facility]  [place] [address] [place], [place]  [id number] [phone] phone [phone] fax

## 2015-09-25 NOTE — Patient Instructions (Signed)

## 2015-09-26 LAB — RHEUMATOID FACTOR: Rhuematoid fact SerPl-aCnc: <10 IU/mL (ref <=14)

## 2015-09-26 LAB — CK: Total CK: 156 U/L (ref 7–232)

## 2015-09-26 LAB — SEDIMENTATION RATE: Sed Rate: 6 mm/hr (ref 0–20)

## 2015-09-28 LAB — ANTI-NUCLEAR AB-TITER (ANA TITER): ANA Titer 1: 1:80 {titer} — ABNORMAL HIGH

## 2015-09-28 LAB — ANA: ANA: POSITIVE — AB

## 2015-10-14 DIAGNOSIS — F1020 Alcohol dependence, uncomplicated: Secondary | ICD-10-CM | POA: Insufficient documentation

## 2015-10-21 NOTE — Addendum Note (Signed)
Addended by: Ethelda ChickSMITH, Rondy Krupinski M on: 10/21/2015 11:56 AM   Modules accepted: Orders

## 2015-10-24 DIAGNOSIS — H52223 Regular astigmatism, bilateral: Secondary | ICD-10-CM | POA: Diagnosis not present

## 2015-11-01 DIAGNOSIS — H35373 Puckering of macula, bilateral: Secondary | ICD-10-CM | POA: Diagnosis not present

## 2015-11-01 DIAGNOSIS — H43813 Vitreous degeneration, bilateral: Secondary | ICD-10-CM | POA: Diagnosis not present

## 2015-11-21 DIAGNOSIS — M545 Low back pain: Secondary | ICD-10-CM | POA: Diagnosis not present

## 2015-11-21 DIAGNOSIS — R768 Other specified abnormal immunological findings in serum: Secondary | ICD-10-CM | POA: Diagnosis not present

## 2015-11-21 DIAGNOSIS — M2550 Pain in unspecified joint: Secondary | ICD-10-CM | POA: Diagnosis not present

## 2015-11-21 DIAGNOSIS — M150 Primary generalized (osteo)arthritis: Secondary | ICD-10-CM | POA: Diagnosis not present

## 2015-11-27 ENCOUNTER — Telehealth: Payer: Self-pay | Admitting: Family Medicine

## 2015-11-27 NOTE — Telephone Encounter (Signed)
Left a message for patient to return call.  Need to get patient set up for a colonoscopy.

## 2015-11-28 ENCOUNTER — Telehealth: Payer: Self-pay

## 2015-11-28 NOTE — Telephone Encounter (Signed)
Patient is returning phone call concerning colonoscopy. [phone]

## 2015-12-12 DIAGNOSIS — R768 Other specified abnormal immunological findings in serum: Secondary | ICD-10-CM | POA: Diagnosis not present

## 2015-12-12 DIAGNOSIS — M150 Primary generalized (osteo)arthritis: Secondary | ICD-10-CM | POA: Diagnosis not present

## 2015-12-12 DIAGNOSIS — M545 Low back pain: Secondary | ICD-10-CM | POA: Diagnosis not present

## 2015-12-12 DIAGNOSIS — M1009 Idiopathic gout, multiple sites: Secondary | ICD-10-CM | POA: Diagnosis not present

## 2015-12-12 DIAGNOSIS — M2550 Pain in unspecified joint: Secondary | ICD-10-CM | POA: Diagnosis not present

## 2015-12-12 DIAGNOSIS — M25561 Pain in right knee: Secondary | ICD-10-CM | POA: Diagnosis not present

## 2015-12-27 ENCOUNTER — Other Ambulatory Visit: Payer: Self-pay | Admitting: Family Medicine

## 2015-12-28 NOTE — Telephone Encounter (Signed)
Please call patient --- refill for Librium not approved.  Please ask why patient is needing a refill of this medication.  (I prescribed this for him to prevent alcohol withdrawals when he stopped drinking this winter).

## 2015-12-29 NOTE — Telephone Encounter (Signed)
Left message for patient to return call.

## 2016-01-01 NOTE — Telephone Encounter (Signed)
Pt called back and LM for me when I was on the phone and then I LM for him again.

## 2016-01-01 NOTE — Telephone Encounter (Signed)
LMOM to CB. 

## 2016-01-08 ENCOUNTER — Ambulatory Visit: Payer: Medicare Other | Admitting: Family Medicine

## 2016-01-09 IMAGING — CR DG KNEE COMPLETE 4+V*R*
3 series · 3 of 3 positions shown · non-contrast
Comparison: None.

CLINICAL DATA: Joint pain

EXAM:
RIGHT KNEE - COMPLETE 4+ VIEW

[other]
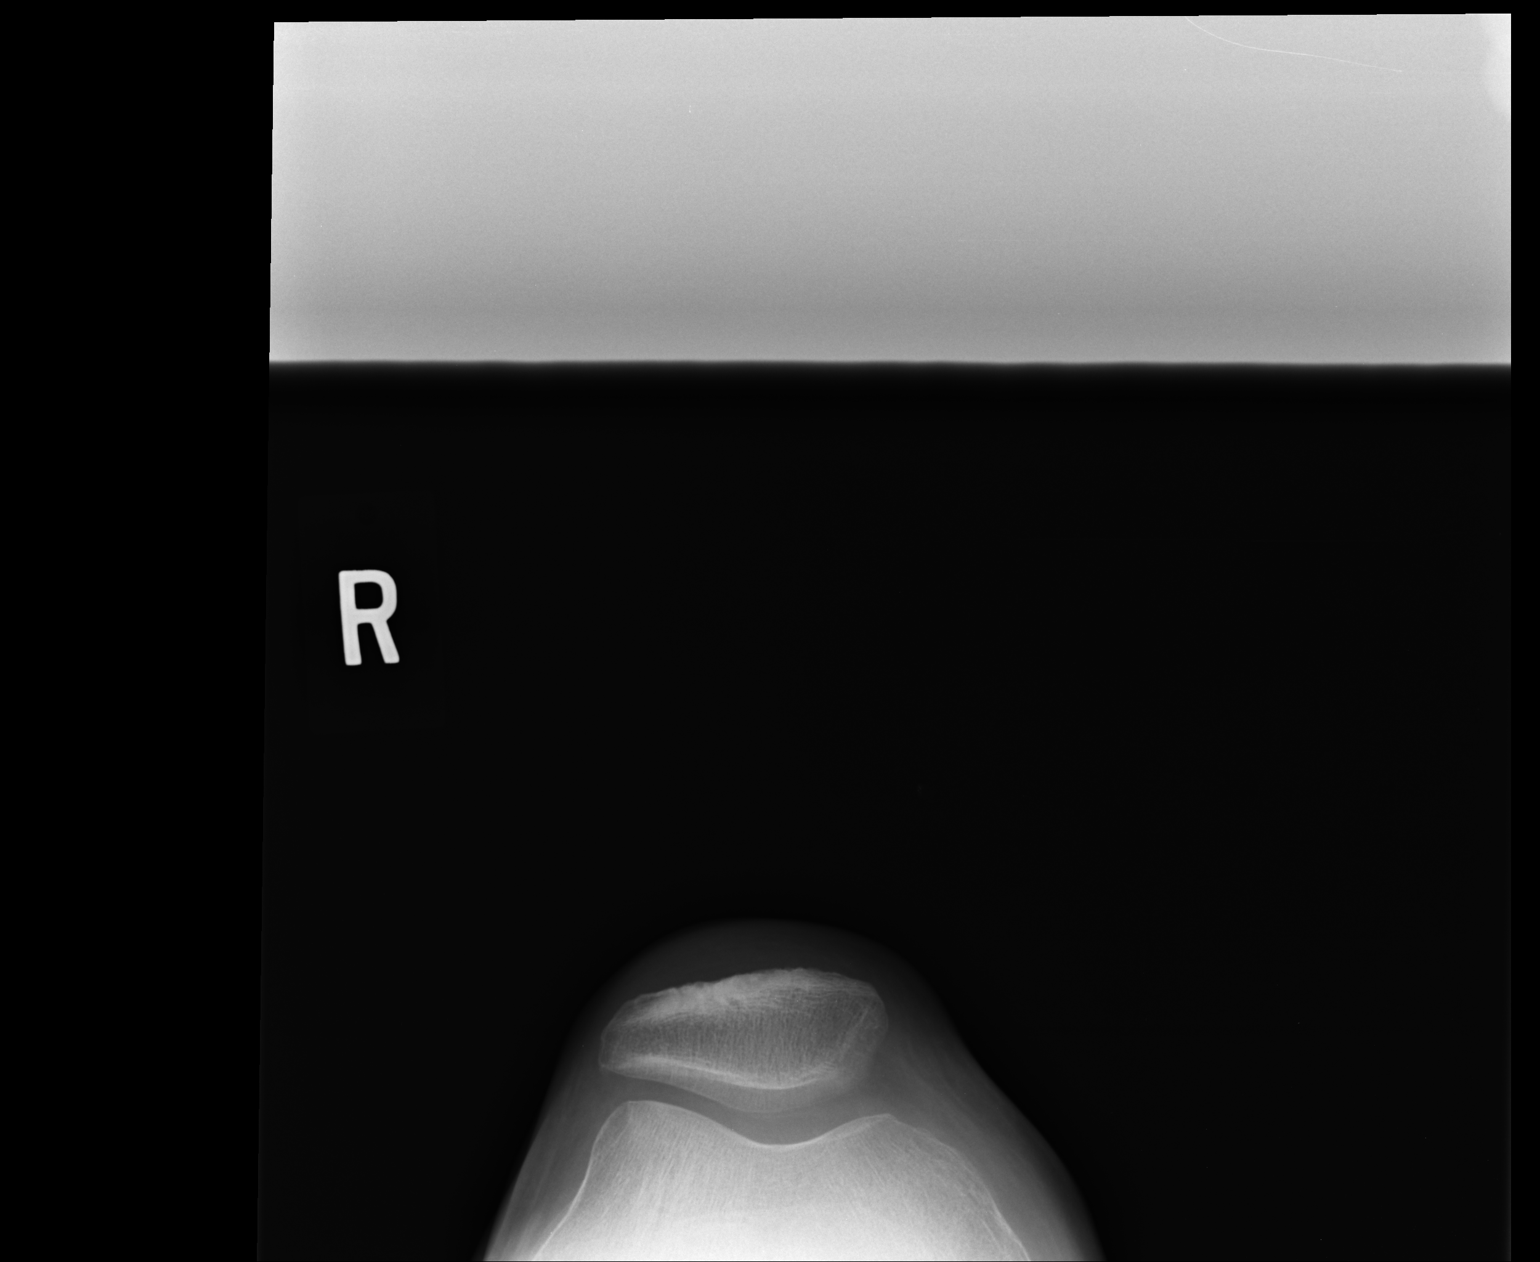

[AP]
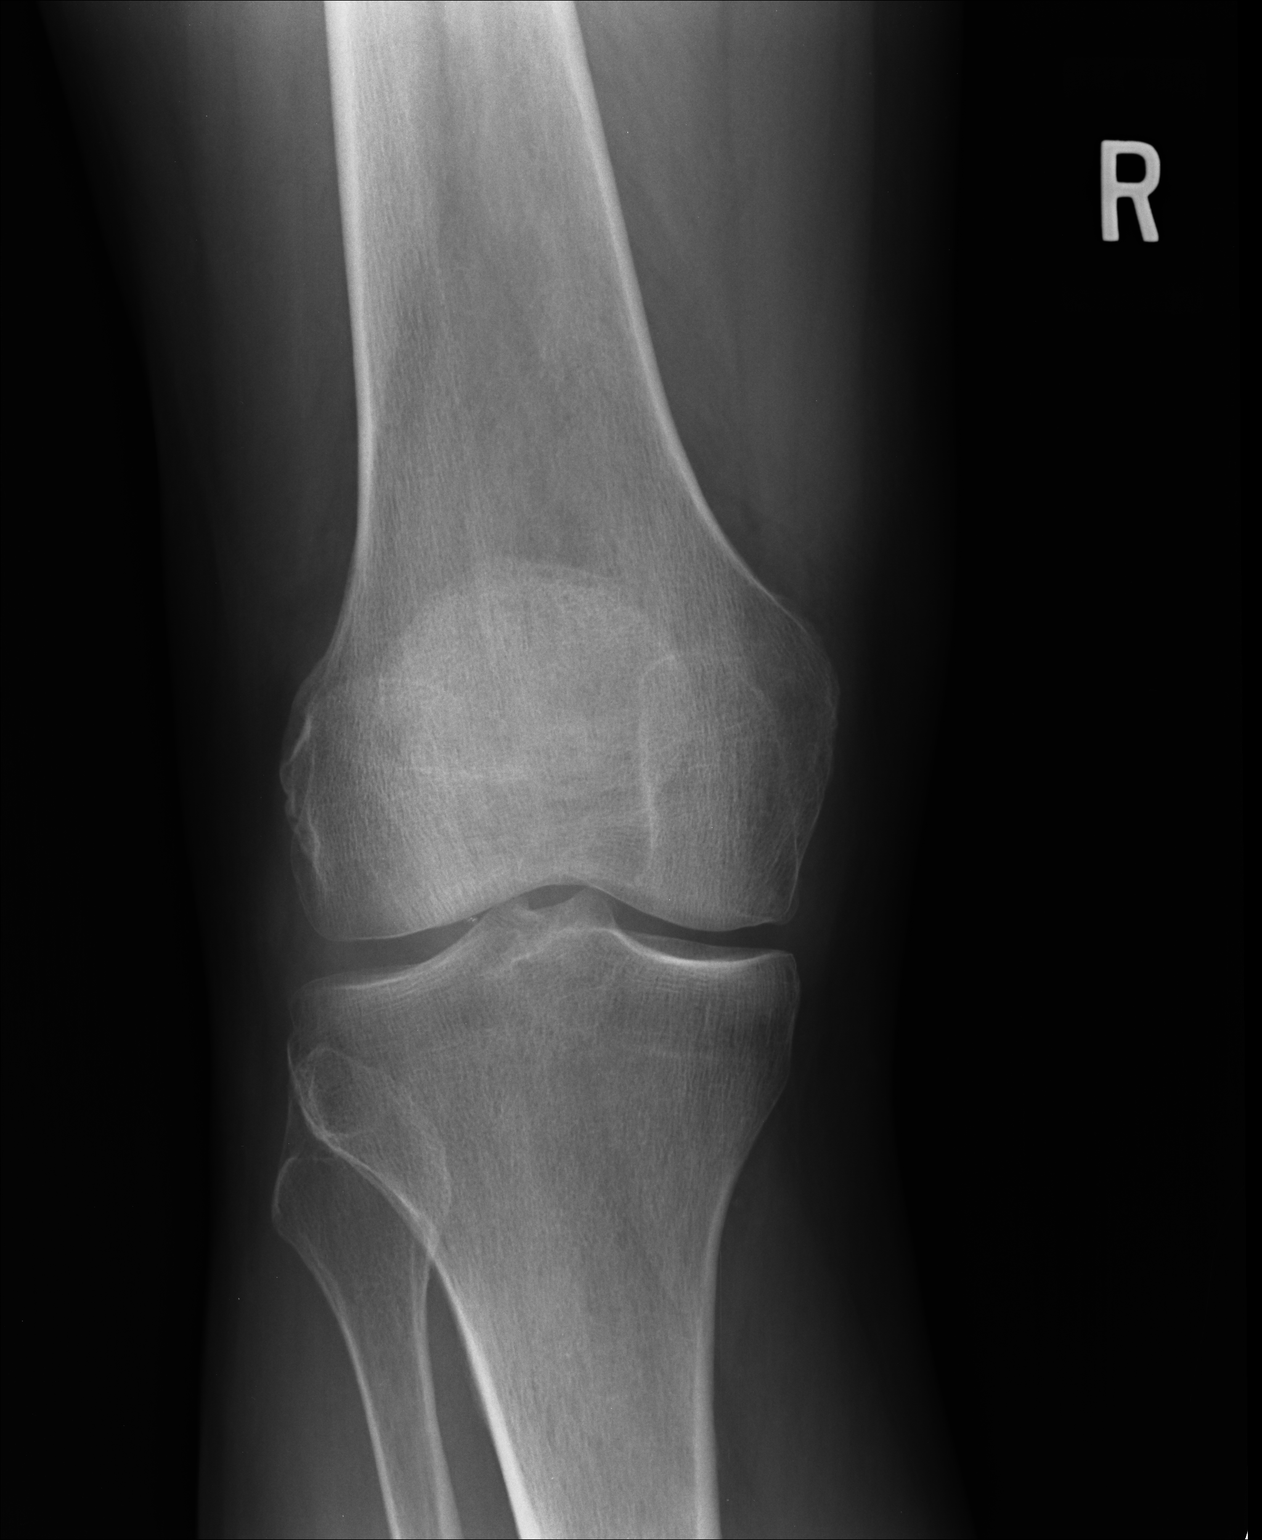

[lateral]
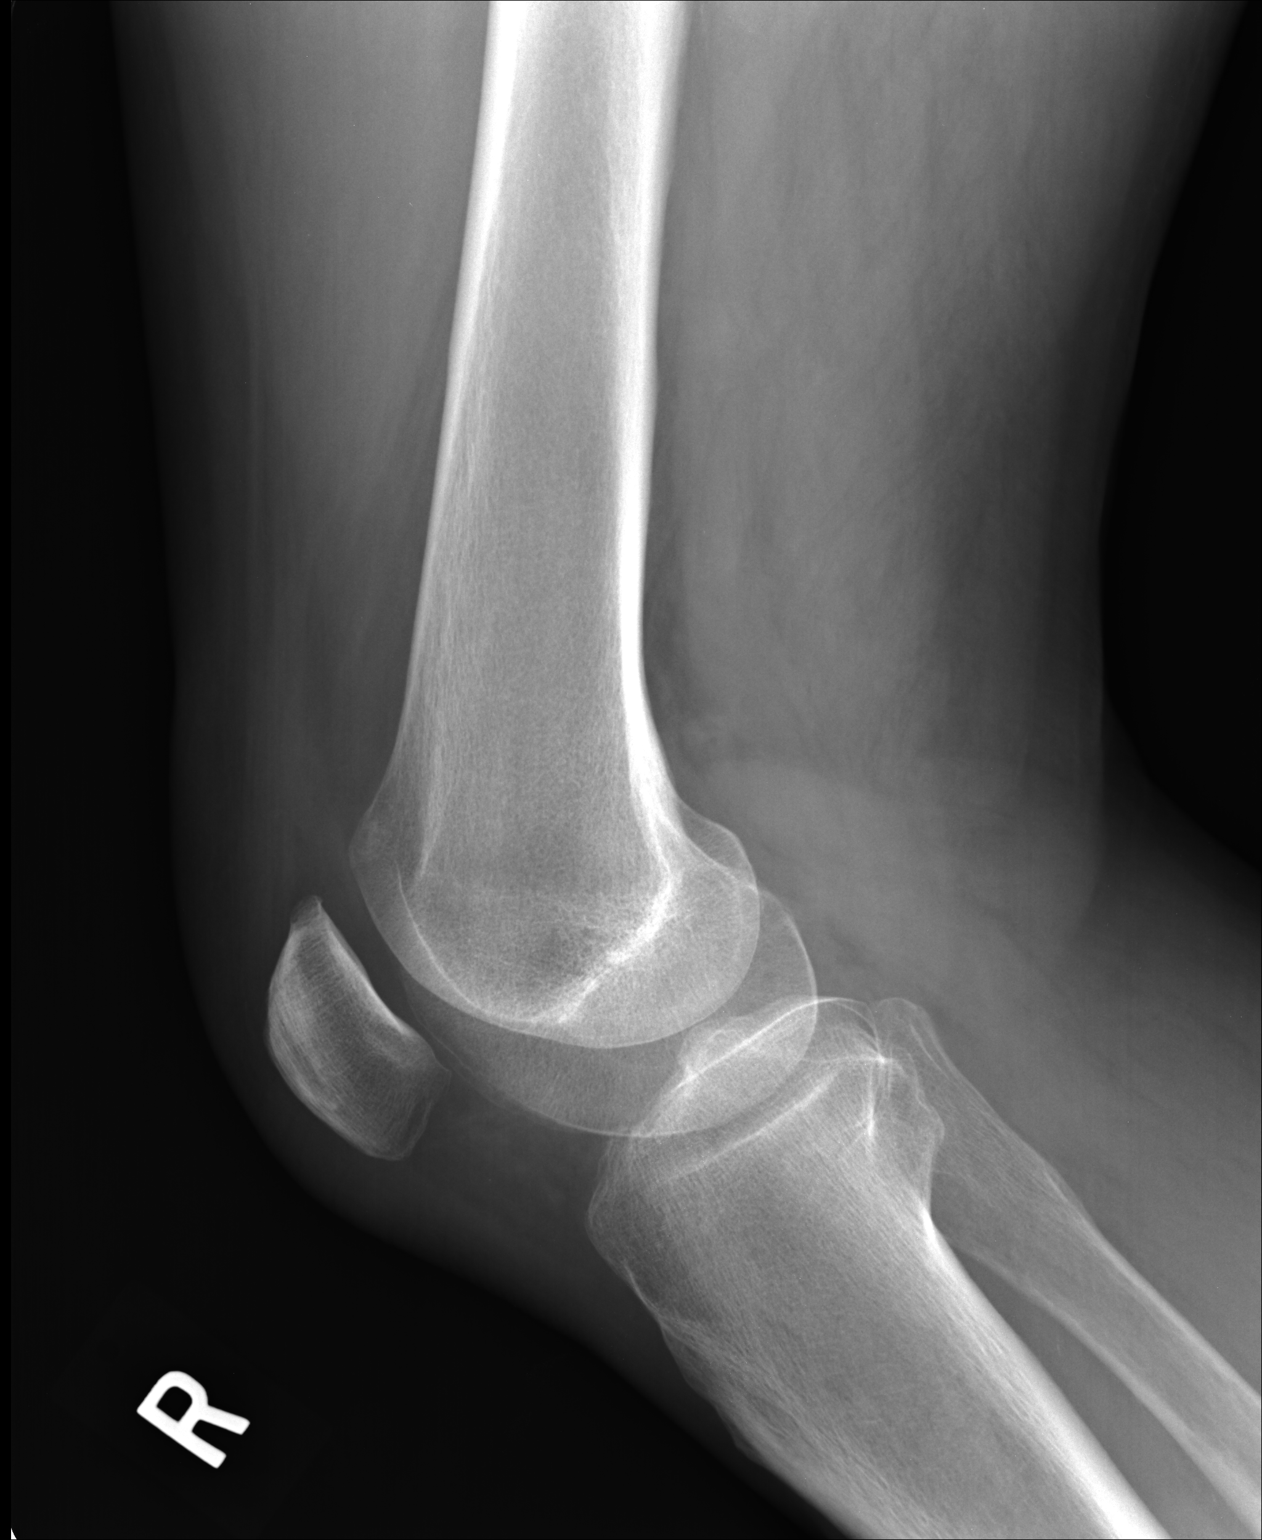

[3 of 3 positions shown; findings below may reference images not displayed]

FINDINGS: There is only slight loss of medial joint space. No significant
degenerative joint disease is seen. No joint effusion is noted. The
patella appears normally position.
IMPRESSION: Mild degenerative joint disease of the medial compartment.

## 2016-01-28 ENCOUNTER — Ambulatory Visit (INDEPENDENT_AMBULATORY_CARE_PROVIDER_SITE_OTHER): Payer: Medicare Other | Admitting: Family Medicine

## 2016-01-28 VITALS — BP 126/78 | HR 97 | Temp 98.9°F | Resp 16 | Ht 70.0 in | Wt 208.0 lb

## 2016-01-28 DIAGNOSIS — Z658 Other specified problems related to psychosocial circumstances: Secondary | ICD-10-CM

## 2016-01-28 DIAGNOSIS — R5383 Other fatigue: Secondary | ICD-10-CM | POA: Diagnosis not present

## 2016-01-28 DIAGNOSIS — F439 Reaction to severe stress, unspecified: Secondary | ICD-10-CM

## 2016-01-28 NOTE — Patient Instructions (Addendum)
Try and make sure you are eating a balanced diet  Get more regular exercise such as walking. It would be good to take an evening walk with your wife if you have the endurance after a days work.  If you feel like you are not doing better, then I think we should rediscuss considering putting you on a low dose of an antidepressant such as sertraline  Return as needed   We will let you know the results of your labs  Keep your appointment with Nilda SimmerKristi Smith M.D.    IF you received an x-ray today, you will receive an invoice from [facility]. Please contact [facility] at [phone] with questions or concerns regarding your invoice.   IF you received labwork today, you will receive an invoice from [facility]. Please contact [facility] at [phone] with questions or concerns regarding your invoice.   Our billing staff will not be able to assist you with questions regarding bills from these companies.  You will be contacted with the lab results as soon as they are available. The fastest way to get your results is to activate your My Chart account. Instructions are located on the last page of this paperwork. If you have not heard from us regarding the results in 2 weeks, please contact this office.

## 2016-01-28 NOTE — Progress Notes (Signed)
Patient ID: Clayton Kin., male    DOB: [birth year]  Age: 70 y.o. MRN: [id number]  Chief Complaint  Patient presents with  . Fatigue    1 WEEK    Subjective:   70 year old man who is here because he is been very fatigued over the last week or 10 days. There is nothing real specific villous change in MRN his life but he just feels completely worn out. He has been working over 50 hours a week even though he is retired 4 times. He is not getting a lot of exercise in part from the work he does at [employer]. He says his wife has early Alzheimer's, is on [employer], and that is made life difficult to him. He has not been able to get out and do as many other things because of his wife's condition. They don't make it to church as often as they used to. He usually sleeps okay but is not rested from his sleep. May go to bed as early as 7 PM and be up by 5 AM. They sleep in separate bedrooms. Review of systems was really negative for multisystem review including HEENT, cardiovascular, respiratory, GI, GU, musculoskeletal. Low-grade depression.  Current allergies, medications, problem list, past/family and social histories reviewed.  Objective:  BP 126/78 mmHg  Pulse 97  Temp(Src) 98.9 F (37.2 C) (Oral)  Resp 16  Ht  (1.778 m)  Wt 208 lb (94.348 kg)  BMI 29.84 kg/m2  SpO2 100%  No major acute distress. Healthy appearance. Denies major depression but he does get sad. His throat is clear. Neck supple without nodes. Chest clear. Heart regular without murmur. Himself without mass or tenderness. Extremities unremarkable.  Assessment & Plan:   Assessment: 1. Other fatigue   2. Stress       Plan: Reviewed his old labs over the last 6 months. Will recheck a couple of things and check a TSH to fill in the holes. He is scheduled to see Clayton Lawson sometime this fall. Will control center antidepressants on him if needed.  EKG was done and did not show any acute changes. There is a minimal  Q-wave formation in III and F, and this was compared with the old EKG and is minimally different over the last 4 years. I did not feel like this warranted significant evaluation at this time.  See instructions.  Orders Placed This Encounter  Procedures  . TSH  . Comprehensive metabolic panel  . CBC  . EKG 12-Lead    No orders of the defined types were placed in this encounter.         Patient Instructions   Try and make sure you are eating a balanced diet  Get more regular exercise such as walking. It would be good to take an evening walk with your wife if you have the endurance after a days work.  If you feel like you are not doing better, then I think we should rediscuss considering putting you on a low dose of an antidepressant such as sertraline  Return as needed   We will let you know the results of your labs  Keep your appointment with Clayton Lawson M.D.    IF you received an x-ray today, you will receive an invoice from [facility]. Please contact [facility] at [phone] with questions or concerns regarding your invoice.   IF you received labwork today, you will receive an invoice from [facility]. Please contact [facility] at [phone]  with questions or concerns regarding your invoice.   Our billing staff will not be able to assist you with questions regarding bills from these companies.  You will be contacted with the lab results as soon as they are available. The fastest way to get your results is to activate your My Chart account. Instructions are located on the last page of this paperwork. If you have not heard from us regarding the results in 2 weeks, please contact this office.         Return if symptoms worsen or fail to improve.   Dorleen Kissel, MD 01/28/2016

## 2016-01-29 LAB — COMPREHENSIVE METABOLIC PANEL
ALT: 16 U/L (ref 9–46)
AST: 16 U/L (ref 10–35)
Albumin: 4.4 g/dL (ref 3.6–5.1)
Alkaline Phosphatase: 84 U/L (ref 40–115)
BUN: 21 mg/dL (ref 7–25)
CO2: 26 mmol/L (ref 20–31)
Calcium: 9.2 mg/dL (ref 8.6–10.3)
Chloride: 99 mmol/L (ref 98–110)
Creat: 1.31 mg/dL — ABNORMAL HIGH (ref 0.70–1.25)
Glucose, Bld: 84 mg/dL (ref 65–99)
Potassium: 3.6 mmol/L (ref 3.5–5.3)
Sodium: 138 mmol/L (ref 135–146)
Total Bilirubin: 0.7 mg/dL (ref 0.2–1.2)
Total Protein: 7.3 g/dL (ref 6.1–8.1)

## 2016-01-29 LAB — CBC
HEMATOCRIT: 40.6 % (ref 38.5–50.0)
HEMOGLOBIN: 13.8 g/dL (ref 13.2–17.1)
MCH: 30.1 pg (ref 27.0–33.0)
MCHC: 34 g/dL (ref 32.0–36.0)
MCV: 88.6 fL (ref 80.0–100.0)
MPV: 9.7 fL (ref 7.5–12.5)
Platelets: 330 10*3/uL (ref 140–400)
RBC: 4.58 MIL/uL (ref 4.20–5.80)
RDW: 14.8 % (ref 11.0–15.0)
WBC: 15 10*3/uL — ABNORMAL HIGH (ref 3.8–10.8)

## 2016-01-29 LAB — TSH: TSH: 5.42 m[IU]/L — AB (ref 0.40–4.50)

## 2016-01-31 ENCOUNTER — Other Ambulatory Visit: Payer: Self-pay | Admitting: *Deleted

## 2016-01-31 MED ORDER — LEVOTHYROXINE SODIUM 50 MCG PO TABS: 50.0000 ug | ORAL_TABLET | Freq: Every day | ORAL | Status: DC

## 2016-02-27 ENCOUNTER — Other Ambulatory Visit: Payer: Self-pay | Admitting: Family Medicine

## 2016-02-29 ENCOUNTER — Other Ambulatory Visit: Payer: Self-pay | Admitting: Family Medicine

## 2016-04-11 ENCOUNTER — Other Ambulatory Visit: Payer: Self-pay | Admitting: Family Medicine

## 2016-05-07 DIAGNOSIS — M25561 Pain in right knee: Secondary | ICD-10-CM | POA: Diagnosis not present

## 2016-05-07 DIAGNOSIS — M150 Primary generalized (osteo)arthritis: Secondary | ICD-10-CM | POA: Diagnosis not present

## 2016-05-07 DIAGNOSIS — H43811 Vitreous degeneration, right eye: Secondary | ICD-10-CM | POA: Diagnosis not present

## 2016-05-07 DIAGNOSIS — M1009 Idiopathic gout, multiple sites: Secondary | ICD-10-CM | POA: Diagnosis not present

## 2016-05-07 DIAGNOSIS — H35372 Puckering of macula, left eye: Secondary | ICD-10-CM | POA: Diagnosis not present

## 2016-05-07 DIAGNOSIS — H43812 Vitreous degeneration, left eye: Secondary | ICD-10-CM | POA: Diagnosis not present

## 2016-05-07 DIAGNOSIS — H35371 Puckering of macula, right eye: Secondary | ICD-10-CM | POA: Diagnosis not present

## 2016-05-07 DIAGNOSIS — M2550 Pain in unspecified joint: Secondary | ICD-10-CM | POA: Diagnosis not present

## 2016-07-01 ENCOUNTER — Ambulatory Visit (INDEPENDENT_AMBULATORY_CARE_PROVIDER_SITE_OTHER): Payer: Medicare Other | Admitting: Family Medicine

## 2016-07-01 ENCOUNTER — Encounter: Payer: Self-pay | Admitting: Family Medicine

## 2016-07-01 VITALS — BP 120/80 | HR 56 | Temp 98.0°F | Resp 16 | Ht 69.0 in | Wt 211.4 lb

## 2016-07-01 DIAGNOSIS — Z23 Encounter for immunization: Secondary | ICD-10-CM | POA: Diagnosis not present

## 2016-07-01 DIAGNOSIS — F418 Other specified anxiety disorders: Secondary | ICD-10-CM | POA: Diagnosis not present

## 2016-07-01 DIAGNOSIS — I10 Essential (primary) hypertension: Secondary | ICD-10-CM | POA: Diagnosis not present

## 2016-07-01 DIAGNOSIS — E7800 Pure hypercholesterolemia, unspecified: Secondary | ICD-10-CM

## 2016-07-01 DIAGNOSIS — E034 Atrophy of thyroid (acquired): Secondary | ICD-10-CM

## 2016-07-01 DIAGNOSIS — Z125 Encounter for screening for malignant neoplasm of prostate: Secondary | ICD-10-CM

## 2016-07-01 DIAGNOSIS — N401 Enlarged prostate with lower urinary tract symptoms: Secondary | ICD-10-CM | POA: Diagnosis not present

## 2016-07-01 DIAGNOSIS — E669 Obesity, unspecified: Secondary | ICD-10-CM

## 2016-07-01 DIAGNOSIS — Z0000 Encounter for general adult medical examination without abnormal findings: Secondary | ICD-10-CM | POA: Diagnosis not present

## 2016-07-01 DIAGNOSIS — F419 Anxiety disorder, unspecified: Secondary | ICD-10-CM

## 2016-07-01 DIAGNOSIS — F1020 Alcohol dependence, uncomplicated: Secondary | ICD-10-CM | POA: Diagnosis not present

## 2016-07-01 DIAGNOSIS — R351 Nocturia: Secondary | ICD-10-CM

## 2016-07-01 DIAGNOSIS — Z1211 Encounter for screening for malignant neoplasm of colon: Secondary | ICD-10-CM | POA: Diagnosis not present

## 2016-07-01 DIAGNOSIS — F32A Depression, unspecified: Secondary | ICD-10-CM

## 2016-07-01 DIAGNOSIS — F329 Major depressive disorder, single episode, unspecified: Secondary | ICD-10-CM

## 2016-07-01 DIAGNOSIS — E66811 Obesity, class 1: Secondary | ICD-10-CM

## 2016-07-01 LAB — POCT URINALYSIS DIP (MANUAL ENTRY)
Bilirubin, UA: NEGATIVE
GLUCOSE UA: NEGATIVE
Ketones, POC UA: NEGATIVE
LEUKOCYTES UA: NEGATIVE
NITRITE UA: NEGATIVE
Protein Ur, POC: NEGATIVE
RBC UA: NEGATIVE
Spec Grav, UA: 1.02
UROBILINOGEN UA: 0.2
pH, UA: 6

## 2016-07-01 MED ORDER — LISINOPRIL-HYDROCHLOROTHIAZIDE 20-25 MG PO TABS: 1.0000 | ORAL_TABLET | Freq: Every day | ORAL | 1 refills | Status: DC

## 2016-07-01 MED ORDER — LEVOTHYROXINE SODIUM 50 MCG PO TABS: 50.0000 ug | ORAL_TABLET | Freq: Every day | ORAL | 3 refills | Status: DC

## 2016-07-01 MED ORDER — PRAVASTATIN SODIUM 40 MG PO TABS: 40.0000 mg | ORAL_TABLET | Freq: Every day | ORAL | 3 refills | Status: DC

## 2016-07-01 MED ORDER — CITALOPRAM HYDROBROMIDE 20 MG PO TABS: 20.0000 mg | ORAL_TABLET | Freq: Every day | ORAL | 1 refills | Status: DC

## 2016-07-01 NOTE — Progress Notes (Signed)
Subjective:    Patient ID: Clayton Kin., male    DOB: [birth year], 70 y.o.   MRN: [id number]  07/01/2016  Annual Exam   HPI This 70 y.o. male presents for Complete Physical Examination.  Last physical: 02-28-2015 Colonoscopy: 11 years ago; 2006.  [facility].  Plans to reschedule.   Eye exam: 2016; [facility]; s/p cataracts B. Floaters   Immunization History  Administered Date(s) Administered  . Influenza Split 05/22/2011, 05/28/2012  . Influenza,inj,Quad PF,36+ Mos 05/12/2013  . Influenza-Unspecified 04/20/2016  . Pneumococcal Conjugate-13 02/28/2015  . Pneumococcal Polysaccharide-23 04/09/2006, 07/01/2016  . Tdap 04/30/2009  . Zoster 07/21/2014   BP Readings from Last 3 Encounters:  07/20/16 133/92  07/01/16 120/80  01/28/16 126/78   Wt Readings from Last 3 Encounters:  07/20/16 211 lb (95.7 kg)  07/01/16 211 lb 6.4 oz (95.9 kg)  01/28/16 208 lb (94.3 kg)   Wife diagnosed with early Alzheimer's disease.  Daughter lives in [place].  Wife still driving.  Still working; now full time but working less hours now that part-time.  No alcohol; last drink 08/2015.  Still having anxiety.  Installed generator for home.    Arthralgias: continues to hurt; Angela injected R knee. Taking Ibuprofen 800mg  tid; Orlin Hilding with [facility]; followed every six months.    HTN: Patient reports good compliance with medication, good tolerance to medication, and good symptom control.    Alcoholism: no alcohol in 08/2015.     Review of Systems  Constitutional: Negative for activity change, appetite change, chills, diaphoresis, fatigue, fever and unexpected weight change.  HENT: Negative for congestion, dental problem, drooling, ear discharge, ear pain, facial swelling, hearing loss, mouth sores, nosebleeds, postnasal drip, rhinorrhea, sinus pressure, sneezing, sore throat, tinnitus, trouble swallowing and voice change.   Eyes: Negative for photophobia, pain, discharge, redness,  itching and visual disturbance.  Respiratory: Negative for apnea, cough, choking, chest tightness, shortness of breath, wheezing and stridor.   Cardiovascular: Negative for chest pain, palpitations and leg swelling.  Gastrointestinal: Negative for abdominal pain, blood in stool, constipation, diarrhea, nausea and vomiting.  Endocrine: Negative for cold intolerance, heat intolerance, polydipsia, polyphagia and polyuria.  Genitourinary: Negative for decreased urine volume, difficulty urinating, discharge, dysuria, enuresis, flank pain, frequency, genital sores, hematuria, penile pain, penile swelling, scrotal swelling, testicular pain and urgency.  Musculoskeletal: Positive for arthralgias. Negative for back pain, gait problem, joint swelling, myalgias, neck pain and neck stiffness.  Skin: Negative for color change, pallor, rash and wound.  Allergic/Immunologic: Negative for environmental allergies, food allergies and immunocompromised state.  Neurological: Negative for dizziness, tremors, seizures, syncope, facial asymmetry, speech difficulty, weakness, light-headedness, numbness and headaches.  Hematological: Negative for adenopathy. Does not bruise/bleed easily.  Psychiatric/Behavioral: Negative for agitation, behavioral problems, confusion, decreased concentration, dysphoric mood, hallucinations, self-injury, sleep disturbance and suicidal ideas. The patient is not nervous/anxious and is not hyperactive.        Bedtime 7:30pm-8:30pm; wakes up at 4:30am.  No insomnia.    Past Medical History:  Diagnosis Date  . Alcoholism in recovery (HCC) 01/19/2015   [facility]  . Anxiety   . Arthritis   . BPH (benign prostatic hyperplasia)   . Cataract   . Gout   . Hyperlipidemia   . Hypertension    Past Surgical History:  Procedure Laterality Date  . ELBOW SURGERY    . EYE SURGERY Bilateral 2013   LENS IMPLANT  . HERNIA REPAIR  1954 and 1965  . KNEE SURGERY    .  SHOULDER SURGERY    .  VASECTOMY  1980   Allergies  Allergen Reactions  . Benadryl [Diphenhydramine Hcl] Anxiety   Current Outpatient Prescriptions  Medication Sig Dispense Refill  . allopurinol (ZYLOPRIM) 300 MG tablet TAKE 1 TABLET(300 MG) BY MOUTH DAILY 90 tablet 0  . diclofenac (VOLTAREN) 75 MG EC tablet Take 1 tablet (75 mg total) by mouth 2 (two) times daily. 60 tablet 0  . fish oil-omega-3 fatty acids 1000 MG capsule Take 2 g by mouth daily.    Marland Kitchen levothyroxine (SYNTHROID, LEVOTHROID) 50 MCG tablet Take 1 tablet (50 mcg total) by mouth daily. 90 tablet 3  . lisinopril-hydrochlorothiazide (PRINZIDE,ZESTORETIC) 20-25 MG tablet Take 1 tablet by mouth daily. 90 tablet 1  . Multiple Vitamin (MULTIVITAMIN) tablet Take 1 tablet by mouth daily.    . pravastatin (PRAVACHOL) 40 MG tablet Take 1 tablet (40 mg total) by mouth daily. 90 tablet 3  . citalopram (CELEXA) 20 MG tablet Take 1 tablet (20 mg total) by mouth daily. 90 tablet 1  . ibuprofen (ADVIL,MOTRIN) 800 MG tablet Take 800 mg by mouth 3 (three) times daily.    . methocarbamol (ROBAXIN) 500 MG tablet Take 1 tablet (500 mg total) by mouth 2 (two) times daily. 20 tablet 0  . traMADol (ULTRAM) 50 MG tablet Take 1 tablet (50 mg total) by mouth every 6 (six) hours as needed. 15 tablet 0   No current facility-administered medications for this visit.    Social History   Social History  . Marital status: Married    Spouse name: N/A  . Number of children: N/A  . Years of education: N/A   Occupational History  . shipping    Social History Main Topics  . Smoking status: Former Smoker    Quit date: 11/09/1974  . Smokeless tobacco: Never Used  . Alcohol use 0.0 oz/week     Comment: denies current use  . Drug use: No  . Sexual activity: No   Other Topics Concern  . Not on file   Social History Narrative   Marital status: married x 44 years; happily married.      Children:  2 CHILDREN; 8 grandchildren; no gg.      Employment:  Full time Nature conservation officer at  [employer].      Tobacco: quit 1976; smoked for 15 years      Alcohol:  Quit drinking in 08/2015; alcoholism; [facility] in 2017.      Exercise doing cardio and light weights; job is physical.  Previous marathon runner.      Seatbelt: 100%      Advanced Directives:  Yes; +living will; +FULL CODE but no prolonged measures.        Guns: unloaded and unsecured.      ADLs: independent with all ADLs.     Family History  Problem Relation Age of Onset  . Stroke Mother     few mini strokes/TIAs  . Diabetes Mother   . Heart disease Father 59    pacemaker  . Cerebral palsy Sister   . Cancer Sister     Breast cancer  . Hyperlipidemia Brother        Objective:    BP 120/80   Pulse (!) 56   Temp 98 F (36.7 C) (Oral)   Resp 16   Ht 5\' 9"  (1.753 m)   Wt 211 lb 6.4 oz (95.9 kg)   SpO2 95%   BMI 31.22 kg/m  Physical Exam  Constitutional: He  is oriented to person, place, and time. He appears well-developed and well-nourished. No distress.  HENT:  Head: Normocephalic and atraumatic.  Right Ear: External ear normal.  Left Ear: External ear normal.  Nose: Nose normal.  Mouth/Throat: Oropharynx is clear and moist.  Eyes: Conjunctivae and EOM are normal. Pupils are equal, round, and reactive to light.  Neck: Normal range of motion. Neck supple. Carotid bruit is not present. No thyromegaly present.  Cardiovascular: Normal rate, regular rhythm, normal heart sounds and intact distal pulses.  Exam reveals no gallop and no friction rub.   No murmur heard. Pulmonary/Chest: Effort normal and breath sounds normal. He has no wheezes. He has no rales.  Abdominal: Soft. Bowel sounds are normal. He exhibits no distension and no mass. There is no tenderness. There is no rebound and no guarding. Hernia confirmed negative in the right inguinal area and confirmed negative in the left inguinal area.  Genitourinary: Testes normal and penis normal.  Musculoskeletal:       Right shoulder: Normal.        Left shoulder: Normal.       Cervical back: Normal.  Lymphadenopathy:    He has no cervical adenopathy.       Right: No inguinal adenopathy present.       Left: No inguinal adenopathy present.  Neurological: He is alert and oriented to person, place, and time. He has normal reflexes. No cranial nerve deficit. He exhibits normal muscle tone. Coordination normal.  Skin: Skin is warm and dry. No rash noted. He is not diaphoretic.  Psychiatric: He has a normal mood and affect. His behavior is normal. Judgment and thought content normal.  Nursing note and vitals reviewed.  Results for orders placed or performed in visit on 07/01/16  CBC with Differential/Platelet  Result Value Ref Range   WBC 7.7 3.4 - 10.8 x10E3/uL   RBC 4.59 4.14 - 5.80 x10E6/uL   Hemoglobin 13.8 13.0 - 17.7 g/dL   Hematocrit 16.1 09.6 - 51.0 %   MCV 91 79 - 97 fL   MCH 30.1 26.6 - 33.0 pg   MCHC 33.2 31.5 - 35.7 g/dL   RDW 04.5 40.9 - 81.1 %   Platelets 281 150 - 379 x10E3/uL   Neutrophils 65 Not Estab. %   Lymphs 24 Not Estab. %   Monocytes 9 Not Estab. %   Eos 2 Not Estab. %   Basos 0 Not Estab. %   Neutrophils Absolute 5.0 1.4 - 7.0 x10E3/uL   Lymphocytes Absolute 1.8 0.7 - 3.1 x10E3/uL   Monocytes Absolute 0.7 0.1 - 0.9 x10E3/uL   EOS (ABSOLUTE) 0.1 0.0 - 0.4 x10E3/uL   Basophils Absolute 0.0 0.0 - 0.2 x10E3/uL   Immature Granulocytes 0 Not Estab. %   Immature Grans (Abs) 0.0 0.0 - 0.1 x10E3/uL  Comprehensive metabolic panel  Result Value Ref Range   Glucose 81 65 - 99 mg/dL   BUN 18 8 - 27 mg/dL   Creatinine, Ser 9.14 0.76 - 1.27 mg/dL   GFR calc non Af Amer 88 >59 mL/min/1.73   GFR calc Af Amer 102 >59 mL/min/1.73   BUN/Creatinine Ratio 21 10 - 24   Sodium 138 134 - 144 mmol/L   Potassium 4.2 3.5 - 5.2 mmol/L   Chloride 99 96 - 106 mmol/L   CO2 22 18 - 29 mmol/L   Calcium 9.3 8.6 - 10.2 mg/dL   Total Protein 7.1 6.0 - 8.5 g/dL   Albumin 4.4 3.6 - 4.8 g/dL  Globulin, Total 2.7 1.5 - 4.5 g/dL     Albumin/Globulin Ratio 1.6 1.2 - 2.2   Bilirubin Total 0.6 0.0 - 1.2 mg/dL   Alkaline Phosphatase 76 39 - 117 IU/L   AST 20 0 - 40 IU/L   ALT 23 0 - 44 IU/L  Lipid panel  Result Value Ref Range   Cholesterol, Total 207 (H) 100 - 199 mg/dL   Triglycerides 540208 (H) 0 - 149 mg/dL   HDL 42 >98>39 mg/dL   VLDL Cholesterol Cal 42 (H) 5 - 40 mg/dL   LDL Calculated 119123 (H) 0 - 99 mg/dL   Chol/HDL Ratio 4.9 0.0 - 5.0 ratio units  PSA  Result Value Ref Range   Prostate Specific Ag, Serum 0.7 0.0 - 4.0 ng/mL  TSH  Result Value Ref Range   TSH 2.230 0.450 - 4.500 uIU/mL  T4, free  Result Value Ref Range   Free T4 1.01 0.82 - 1.77 ng/dL  POCT urinalysis dipstick  Result Value Ref Range   Color, UA yellow yellow   Clarity, UA clear clear   Glucose, UA negative negative   Bilirubin, UA negative negative   Ketones, POC UA negative negative   Spec Grav, UA 1.020    Blood, UA negative negative   pH, UA 6.0    Protein Ur, POC negative negative   Urobilinogen, UA 0.2    Nitrite, UA Negative Negative   Leukocytes, UA Negative Negative   Depression screen [facility] 07/01/2016 01/28/2016 09/25/2015 09/05/2015 09/02/2015  Decreased Interest 0 0 0 0 0  Down, Depressed, Hopeless 0 0 0 0 0  PHQ - 2 Score 0 0 0 0 0   Fall Risk  07/01/2016 01/28/2016 09/25/2015 09/05/2015 08/13/2015  Falls in the past year? No No No No No        Assessment & Plan:   1. Encounter for Medicare annual wellness exam   2. Colon cancer screening   3. Pure hypercholesterolemia   4. Essential hypertension, benign   5. Hypothyroidism due to acquired atrophy of thyroid   6. Prostate cancer screening   7. Need for prophylactic vaccination against Streptococcus pneumoniae (pneumococcus)   8. Benign prostatic hyperplasia with nocturia   9. Obesity (BMI 30.0-34.9)   10. Alcoholism (HCC)   11. Anxiety and depression    -start Citalopram for anxiety and depression related to wife with dementia. Orders Placed This Encounter   Procedures  . Pneumococcal polysaccharide vaccine 23-valent greater than or equal to 2yo subcutaneous/IM  . CBC with Differential/Platelet  . Comprehensive metabolic panel    Order Specific Question:   Has the patient fasted?    Answer:   Yes  . Lipid panel    Order Specific Question:   Has the patient fasted?    Answer:   Yes  . PSA  . TSH  . T4, free  . Ambulatory referral to Gastroenterology    Referral Priority:   Routine    Referral Type:   Consultation    Referral Reason:   Specialty Services Required    Requested Specialty:   Gastroenterology    Number of Visits Requested:   1  . POCT urinalysis dipstick  . EKG 12-Lead   Meds ordered this encounter  Medications  . citalopram (CELEXA) 20 MG tablet    Sig: Take 1 tablet (20 mg total) by mouth daily.    Dispense:  90 tablet    Refill:  1  . pravastatin (PRAVACHOL) 40 MG tablet  Sig: Take 1 tablet (40 mg total) by mouth daily.    Dispense:  90 tablet    Refill:  3  . lisinopril-hydrochlorothiazide (PRINZIDE,ZESTORETIC) 20-25 MG tablet    Sig: Take 1 tablet by mouth daily.    Dispense:  90 tablet    Refill:  1  . levothyroxine (SYNTHROID, LEVOTHROID) 50 MCG tablet    Sig: Take 1 tablet (50 mcg total) by mouth daily.    Dispense:  90 tablet    Refill:  3    Return in about 4 months (around 10/30/2016) for recheck high blood pressure, anxiety, high cholesterol.  Kori Goins Paulita FujitaMartin Vick Filter, M.D. [facility]  [place] [address] [place], [place]  [id number] [phone] phone [phone]) [phone] fax

## 2016-07-01 NOTE — Patient Instructions (Addendum)
   IF you received an x-ray today, you will receive an invoice from [facility]. Please contact [facility] at [phone] with questions or concerns regarding your invoice.   IF you received labwork today, you will receive an invoice from [facility]. Please contact [facility] at [phone] with questions or concerns regarding your invoice.   Our billing staff will not be able to assist you with questions regarding bills from these companies.  You will be contacted with the lab results as soon as they are available. The fastest way to get your results is to activate your My Chart account. Instructions are located on the last page of this paperwork. If you have not heard from us regarding the results in 2 weeks, please contact this office.    Keeping you healthy  Get these tests  Blood pressure- Have your blood pressure checked once a year by your healthcare provider.  Normal blood pressure is 120/80  Weight- Have your body mass index (BMI) calculated to screen for obesity.  BMI is a measure of body fat based on height and weight. You can also calculate your own BMI at [email or web address].com/bmi/.  Cholesterol- Have your cholesterol checked every year.  Diabetes- Have your blood sugar checked regularly if you have high blood pressure, high cholesterol, have a family history of diabetes or if you are overweight.  Screening for Colon Cancer- Colonoscopy starting at age 50.  Screening may begin sooner depending on your family history and other health conditions. Follow up colonoscopy as directed by your Gastroenterologist.  Screening for Prostate Cancer- Both blood work (PSA) and a rectal exam help screen for Prostate Cancer.  Screening begins at age 40 with African-American men and at age 50 with Caucasian men.  Screening may begin sooner depending on your family history.  Take these medicines  Aspirin- One aspirin daily can help prevent Heart  disease and Stroke.  Flu shot- Every fall.  Tetanus- Every 10 years.  Zostavax- Once after the age of 60 to prevent Shingles.  Pneumonia shot- Once after the age of 65; if you are younger than 65, ask your healthcare provider if you need a Pneumonia shot.  Take these steps  Don't smoke- If you do smoke, talk to your doctor about quitting.  For tips on how to quit, go to www.smokefree.gov or call 1-800-QUIT-NOW.  Be physically active- Exercise 5 days a week for at least 30 minutes.  If you are not already physically active start slow and gradually work up to 30 minutes of moderate physical activity.  Examples of moderate activity include walking briskly, mowing the yard, dancing, swimming, bicycling, etc.  Eat a healthy diet- Eat a variety of healthy food such as fruits, vegetables, low fat milk, low fat cheese, yogurt, lean meant, poultry, fish, beans, tofu, etc. For more information go to www.thenutritionsource.org  Drink alcohol in moderation- Limit alcohol intake to less than two drinks a day. Never drink and drive.  Dentist- Brush and floss twice daily; visit your dentist twice a year.  Depression- Your emotional health is as important as your physical health. If you're feeling down, or losing interest in things you would normally enjoy please talk to your healthcare provider.  Eye exam- Visit your eye doctor every year.  Safe sex- If you may be exposed to a sexually transmitted infection, use a condom.  Seat belts- Seat belts can save your life; always wear one.  Smoke/Carbon Monoxide detectors- These detectors need to be installed on   the appropriate level of your home.  Replace batteries at least once a year.  Skin cancer- When out in the sun, cover up and use sunscreen 15 SPF or higher.  Violence- If anyone is threatening you, please tell your healthcare provider.  Living Will/ Health care power of attorney- Speak with your healthcare provider and family. 

## 2016-07-01 NOTE — Progress Notes (Signed)
   Subjective:    Patient ID: Clayton Kinhomas A Eckerman Jr., male    DOB: 11/06/1945, 70 y.o.   MRN: [id number]  HPI    Review of Systems  Constitutional: Negative.   HENT: Positive for hearing loss and sinus pressure.   Eyes: Positive for visual disturbance.  Respiratory: Negative.   Cardiovascular: Negative.   Gastrointestinal: Negative.   Endocrine: Negative.   Genitourinary: Negative.   Musculoskeletal: Positive for arthralgias and back pain.  Skin: Negative.   Allergic/Immunologic: Negative.   Neurological: Positive for numbness.  Hematological: Negative.   Psychiatric/Behavioral: Negative.        Objective:   Physical Exam        Assessment & Plan:

## 2016-07-02 LAB — CBC WITH DIFFERENTIAL/PLATELET
BASOS ABS: 0 10*3/uL (ref 0.0–0.2)
Basos: 0 %
EOS (ABSOLUTE): 0.1 10*3/uL (ref 0.0–0.4)
Eos: 2 %
Hematocrit: 41.6 % (ref 37.5–51.0)
Hemoglobin: 13.8 g/dL (ref 13.0–17.7)
Immature Grans (Abs): 0 10*3/uL (ref 0.0–0.1)
Immature Granulocytes: 0 %
LYMPHS ABS: 1.8 10*3/uL (ref 0.7–3.1)
Lymphs: 24 %
MCH: 30.1 pg (ref 26.6–33.0)
MCHC: 33.2 g/dL (ref 31.5–35.7)
MCV: 91 fL (ref 79–97)
MONOS ABS: 0.7 10*3/uL (ref 0.1–0.9)
Monocytes: 9 %
NEUTROS ABS: 5 10*3/uL (ref 1.4–7.0)
Neutrophils: 65 %
PLATELETS: 281 10*3/uL (ref 150–379)
RBC: 4.59 x10E6/uL (ref 4.14–5.80)
RDW: 13.7 % (ref 12.3–15.4)
WBC: 7.7 10*3/uL (ref 3.4–10.8)

## 2016-07-02 LAB — COMPREHENSIVE METABOLIC PANEL
A/G RATIO: 1.6 (ref 1.2–2.2)
ALT: 23 IU/L (ref 0–44)
AST: 20 IU/L (ref 0–40)
Albumin: 4.4 g/dL (ref 3.6–4.8)
Alkaline Phosphatase: 76 IU/L (ref 39–117)
BUN/Creatinine Ratio: 21 (ref 10–24)
BUN: 18 mg/dL (ref 8–27)
Bilirubin Total: 0.6 mg/dL (ref 0.0–1.2)
CALCIUM: 9.3 mg/dL (ref 8.6–10.2)
CO2: 22 mmol/L (ref 18–29)
CREATININE: 0.87 mg/dL (ref 0.76–1.27)
Chloride: 99 mmol/L (ref 96–106)
GFR, EST AFRICAN AMERICAN: 102 mL/min/{1.73_m2} (ref >59)
GFR, EST NON AFRICAN AMERICAN: 88 mL/min/{1.73_m2} (ref >59)
GLOBULIN, TOTAL: 2.7 g/dL (ref 1.5–4.5)
GLUCOSE: 81 mg/dL (ref 65–99)
POTASSIUM: 4.2 mmol/L (ref 3.5–5.2)
SODIUM: 138 mmol/L (ref 134–144)
TOTAL PROTEIN: 7.1 g/dL (ref 6.0–8.5)

## 2016-07-02 LAB — T4, FREE: FREE T4: 1.01 ng/dL (ref 0.82–1.77)

## 2016-07-02 LAB — LIPID PANEL
CHOL/HDL RATIO: 4.9 ratio (ref 0.0–5.0)
Cholesterol, Total: 207 mg/dL — ABNORMAL HIGH (ref 100–199)
HDL: 42 mg/dL (ref >39)
LDL CALC: 123 mg/dL — AB (ref 0–99)
TRIGLYCERIDES: 208 mg/dL — AB (ref 0–149)
VLDL Cholesterol Cal: 42 mg/dL — ABNORMAL HIGH (ref 5–40)

## 2016-07-02 LAB — TSH: TSH: 2.23 u[IU]/mL (ref 0.450–4.500)

## 2016-07-02 LAB — PSA: Prostate Specific Ag, Serum: 0.7 ng/mL (ref 0.0–4.0)

## 2016-07-20 ENCOUNTER — Encounter (HOSPITAL_BASED_OUTPATIENT_CLINIC_OR_DEPARTMENT_OTHER): Payer: Self-pay | Admitting: *Deleted

## 2016-07-20 ENCOUNTER — Emergency Department (HOSPITAL_BASED_OUTPATIENT_CLINIC_OR_DEPARTMENT_OTHER)
Admission: EM | Admit: 2016-07-20 | Discharge: 2016-07-20 | Disposition: A | Discharge status: Home / Self Care | Payer: Medicare Other | Attending: Emergency Medicine | Admitting: Emergency Medicine

## 2016-07-20 DIAGNOSIS — Y939 Activity, unspecified: Secondary | ICD-10-CM | POA: Diagnosis not present

## 2016-07-20 DIAGNOSIS — Z79899 Other long term (current) drug therapy: Secondary | ICD-10-CM | POA: Diagnosis not present

## 2016-07-20 DIAGNOSIS — I10 Essential (primary) hypertension: Secondary | ICD-10-CM | POA: Diagnosis not present

## 2016-07-20 DIAGNOSIS — Y990 Civilian activity done for income or pay: Secondary | ICD-10-CM | POA: Diagnosis not present

## 2016-07-20 DIAGNOSIS — Z791 Long term (current) use of non-steroidal anti-inflammatories (NSAID): Secondary | ICD-10-CM | POA: Insufficient documentation

## 2016-07-20 DIAGNOSIS — S161XXA Strain of muscle, fascia and tendon at neck level, initial encounter: Secondary | ICD-10-CM | POA: Insufficient documentation

## 2016-07-20 DIAGNOSIS — X500XXA Overexertion from strenuous movement or load, initial encounter: Secondary | ICD-10-CM | POA: Diagnosis not present

## 2016-07-20 DIAGNOSIS — Z87891 Personal history of nicotine dependence: Secondary | ICD-10-CM | POA: Insufficient documentation

## 2016-07-20 DIAGNOSIS — S199XXA Unspecified injury of neck, initial encounter: Secondary | ICD-10-CM | POA: Diagnosis present

## 2016-07-20 DIAGNOSIS — T148XXA Other injury of unspecified body region, initial encounter: Secondary | ICD-10-CM

## 2016-07-20 DIAGNOSIS — M542 Cervicalgia: Secondary | ICD-10-CM

## 2016-07-20 DIAGNOSIS — Y929 Unspecified place or not applicable: Secondary | ICD-10-CM | POA: Insufficient documentation

## 2016-07-20 MED ORDER — TRAMADOL HCL 50 MG PO TABS: 50.0000 mg | ORAL_TABLET | Freq: Four times a day (QID) | ORAL | 0 refills | Status: DC | PRN

## 2016-07-20 MED ORDER — METHOCARBAMOL 500 MG PO TABS: 500.0000 mg | ORAL_TABLET | Freq: Two times a day (BID) | ORAL | 0 refills | Status: DC

## 2016-07-20 NOTE — ED Notes (Signed)
Pt ambulatory with steady gait to d/c window

## 2016-07-20 NOTE — ED Provider Notes (Signed)
[facility] Provider Note   CSN: [id number] Arrival date & time: 07/20/16  4098     History   Chief Complaint Chief Complaint  Patient presents with  . Back Pain    HPI Clayton Thoman. is a 70 y.o. male. Presents evaluation of neck and back pain. He's had no trauma. 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 Very minimal hands on her manual labor. Through 4 nights ago he noticed some tingling and burning and pain at the base of his neck. This is bilateral. It is it is tender. Hurts to move his neck. This kept him from sleep. No extremity symptoms. No weakness or numbness. No chest pain. No difficulty breathing.  HPI  Past Medical History:  Diagnosis Date  . Anxiety   . Arthritis   . BPH (benign prostatic hyperplasia)   . Cataract   . Gout   . Hyperlipidemia   . Hypertension     Patient Active Problem List   Diagnosis Date Noted  . Alcoholism (HCC) 10/14/2015  . Acute cystitis without hematuria 08/13/2015  . Meralgia paraesthetica 08/13/2015  . HTN (hypertension) 05/28/2012  . 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 BPH (benign prostatic hyperplasia) 05/28/2012  . Obesity (BMI 30.0-34.9) 05/28/2012  . Dyslipidemia 05/28/2012    Past Surgical History:  Procedure Laterality Date  . ELBOW SURGERY    . EYE SURGERY Bilateral 2013   LENS IMPLANT  . HERNIA REPAIR  1954 and 1965  . KNEE SURGERY    . SHOULDER SURGERY    . VASECTOMY  1980       Home Medications    Prior to Admission medications   Medication Sig Start Date End Date Taking? Authorizing Provider  allopurinol (ZYLOPRIM) 300 MG tablet TAKE 1 TABLET(300 MG) BY MOUTH DAILY 04/15/16  Yes Gwenlyn Found Copland, MD  citalopram (CELEXA) 20 MG tablet Take 1 tablet (20 mg total) by mouth daily. 07/01/16  Yes Ethelda Chick, MD  fish oil-omega-3 fatty acids 1000 MG capsule Take 2 g by mouth daily.   Yes Historical Provider, MD    ibuprofen (ADVIL,MOTRIN) 800 MG tablet Take 800 mg by mouth 3 (three) times daily.   Yes Historical Provider, MD  levothyroxine (SYNTHROID, LEVOTHROID) 50 MCG tablet Take 1 tablet (50 mcg total) by mouth daily. 07/01/16  Yes Ethelda Chick, MD  lisinopril-hydrochlorothiazide (PRINZIDE,ZESTORETIC) 20-25 MG tablet Take 1 tablet by mouth daily. 07/01/16  Yes Ethelda Chick, MD  Multiple Vitamin (MULTIVITAMIN) tablet Take 1 tablet by mouth daily.   Yes Historical Provider, MD  pravastatin (PRAVACHOL) 40 MG tablet Take 1 tablet (40 mg total) by mouth daily. 07/01/16  Yes Ethelda Chick, MD  chlordiazePOXIDE (LIBRIUM) 10 MG capsule Take 1 capsule (10 mg total) by mouth 2 (two) times daily. Take one every morning and one every afternoon for one week and then decrease to one every morning for one week and then stop Patient not taking: Reported on 07/01/2016 09/05/15   Ethelda Chick, MD  chlordiazePOXIDE (LIBRIUM) 25 MG capsule Take 1-2 capsules (25-50 mg total) by mouth 3 (three) times daily as needed for anxiety. Patient not taking: Reported on 07/01/2016 08/30/15   Ethelda Chick, MD  diclofenac (VOLTAREN) 75 MG EC tablet Take 1 tablet (75 mg total) by mouth 2 (two) times daily. 09/25/15   Ethelda Chick, MD  meloxicam (MOBIC) 15 MG tablet Take 1 tablet (15 mg total) by  mouth daily. Patient not taking: Reported on 07/01/2016 02/28/15   Ethelda ChickKristi M Smith, MD  methocarbamol (ROBAXIN) 500 MG tablet Take 1 tablet (500 mg total) by mouth 2 (two) times daily. 07/20/16   Rolland PorterMark Sinaya Minogue, MD  traMADol (ULTRAM) 50 MG tablet Take 1 tablet (50 mg total) by mouth every 6 (six) hours as needed. 07/20/16   Rolland PorterMark Graceson Nichelson, MD    Family History Family History  Problem Relation Age of Onset  . Stroke Mother     few mini strokes/TIAs  . Diabetes Mother   . Heart disease Father 2094    pacemaker  . Cerebral palsy Sister   . Cancer Sister     Breast cancer  . Hyperlipidemia Brother     Social History Social History  Substance  Use Topics  . Smoking status: Former Smoker    Quit date: 11/09/1974  . Smokeless tobacco: Never Used  . Alcohol use 0.0 oz/week     Comment: denies current use     Allergies   Benadryl [diphenhydramine hcl]   Review of Systems Review of Systems  Constitutional: Negative for appetite change, chills, diaphoresis, fatigue and fever.  HENT: Negative for mouth sores, sore throat and trouble swallowing.   Eyes: Negative for visual disturbance.  Respiratory: Negative for cough, chest tightness, shortness of breath and wheezing.   Cardiovascular: Negative for chest pain.  Gastrointestinal: Negative for abdominal distention, abdominal pain, diarrhea, nausea and vomiting.  Endocrine: Negative for polydipsia, polyphagia and polyuria.  Genitourinary: Negative for dysuria, frequency and hematuria.  Musculoskeletal: Positive for neck pain and neck stiffness. Negative for gait problem.  Skin: Negative for color change, pallor and rash.  Neurological: Negative for dizziness, syncope, light-headedness and headaches.  Hematological: Does not bruise/bleed easily.  Psychiatric/Behavioral: Negative for behavioral problems and confusion.     Physical Exam Updated Vital Signs BP 133/92 (BP Location: Left Arm)   Pulse (!) 58   Temp 98 F (36.7 C) (Oral)   Resp 18   Ht 5\' 9"  (1.753 m)   Wt 211 lb (95.7 kg)   SpO2 98%   BMI 31.16 kg/m   Physical Exam  Constitutional: He is oriented to person, place, and time. He appears well-developed and well-nourished. No distress.  HENT:  Head: Normocephalic.  Eyes: Conjunctivae are normal. Pupils are equal, round, and reactive to light. No scleral icterus.  Neck: Normal range of motion. Neck supple. No thyromegaly present.    Pain with range of motion including rotation or flexion extension. No meningismus. Negative Spurling's. No Lhermitte's description.  Cardiovascular: Normal rate and regular rhythm.  Exam reveals no gallop and no friction rub.     No murmur heard. Pulmonary/Chest: Effort normal and breath sounds normal. No respiratory distress. He has no wheezes. He has no rales.  Abdominal: Soft. Bowel sounds are normal. He exhibits no distension. There is no tenderness. There is no rebound.  Musculoskeletal: Normal range of motion.  Neurological: He is alert and oriented to person, place, and time.  Normal symmetric Strength to shoulder shrug, triceps, biceps, grip,wrist flex/extend,and intrinsics  Norma lsymmetric sensation above and below clavicles, and to all distributions to UEs.    Skin: Skin is warm and dry. No rash noted.  Psychiatric: He has a normal mood and affect. His behavior is normal.     ED Treatments / Results  Labs (all labs ordered are listed, but only abnormal results are displayed) Labs Reviewed - No data to display  EKG  EKG Interpretation None  Radiology No results found.  Procedures Procedures (including critical care time)  Medications Ordered in ED Medications - No data to display   Initial Impression / Assessment and Plan / ED Course  I have reviewed the triage vital signs and the nursing notes.  Pertinent labs & imaging results that were available during my care of the patient were reviewed by me and considered in my medical decision making (see chart for details).  Clinical Course     No symptoms or findings to suggest that this is cardiac or pulmonary. No neurological findings or symptoms. Localized symptoms. No Lhermitte's or Spurling's to suggest cervical disc. Normal temperature neurological exam. Normal gait. Plan muscle relaxant, continuing his anti-inflammatory, Ultram at night as needed for sleep. Return to ER with worsening symptoms or change in symptoms.  Final Clinical Impressions(s) / ED Diagnoses   Final diagnoses:  Neck pain  Muscle strain    New Prescriptions New Prescriptions   METHOCARBAMOL (ROBAXIN) 500 MG TABLET    Take 1 tablet (500 mg total) by  mouth 2 (two) times daily.   TRAMADOL (ULTRAM) 50 MG TABLET    Take 1 tablet (50 mg total) by mouth every 6 (six) hours as needed.     Rolland PorterMark Jacier Gladu, MD 07/20/16 [phone]

## 2016-07-20 NOTE — Discharge Instructions (Signed)
Continue your Motrin as prescribed.  Robaxin, muscle relaxant.  Ultram for pain

## 2016-07-20 NOTE — ED Triage Notes (Signed)
Pt c/o pain and numbness between shoulder blades at base of his neck since Tuesday. States it is keeping him from being able to sleep. Pt takes 800mg  ibuprofen TID. Denies injury but states he does lifting at at work.

## 2016-07-20 NOTE — ED Notes (Signed)
ED Provider at bedside Dr. James 

## 2016-07-21 ENCOUNTER — Encounter: Payer: Self-pay | Admitting: Family Medicine

## 2016-07-21 DIAGNOSIS — F329 Major depressive disorder, single episode, unspecified: Secondary | ICD-10-CM | POA: Insufficient documentation

## 2016-07-21 DIAGNOSIS — F419 Anxiety disorder, unspecified: Secondary | ICD-10-CM

## 2016-07-21 DIAGNOSIS — F32A Depression, unspecified: Secondary | ICD-10-CM | POA: Insufficient documentation

## 2016-10-09 ENCOUNTER — Telehealth: Payer: Self-pay | Admitting: Family Medicine

## 2016-10-09 NOTE — Telephone Encounter (Signed)
LMOM FOR PT TO CALL BACK TO RESCHEDULE HIS APPOINTMENT THAT HE HAD WITH [facility] ON 10-29-16

## 2016-10-29 ENCOUNTER — Ambulatory Visit: Payer: Medicare Other | Admitting: Family Medicine

## 2016-10-29 DIAGNOSIS — H52223 Regular astigmatism, bilateral: Secondary | ICD-10-CM | POA: Diagnosis not present

## 2016-11-05 DIAGNOSIS — H35373 Puckering of macula, bilateral: Secondary | ICD-10-CM | POA: Diagnosis not present

## 2016-11-05 DIAGNOSIS — H43813 Vitreous degeneration, bilateral: Secondary | ICD-10-CM | POA: Diagnosis not present

## 2016-12-31 NOTE — Telephone Encounter (Signed)
error 

## 2017-01-24 ENCOUNTER — Other Ambulatory Visit: Payer: Self-pay | Admitting: Family Medicine

## 2017-01-26 ENCOUNTER — Telehealth: Payer: Self-pay | Admitting: Family Medicine

## 2017-01-26 NOTE — Telephone Encounter (Signed)
LMOM FOR PT TO SCHEDULE AN OV HE'S  OVERDUE FOR UP ON MED REFILL AND LABS  

## 2017-01-26 NOTE — Telephone Encounter (Signed)
LMOM FOR PT TO SCHEDULE AN OV HE'S  OVERDUE FOR UP ON MED REFILL AND LABS

## 2017-01-26 NOTE — Telephone Encounter (Signed)
Call --- I only approved a 30 day supply of Lisinopril for him as he is overdue for follow-up.  Please schedule follow-up OV with me in upcoming month.

## 2017-02-06 ENCOUNTER — Encounter: Payer: Self-pay | Admitting: Family Medicine

## 2017-02-06 ENCOUNTER — Ambulatory Visit (INDEPENDENT_AMBULATORY_CARE_PROVIDER_SITE_OTHER): Payer: Medicare Other | Admitting: Family Medicine

## 2017-02-06 VITALS — BP 121/74 | HR 57 | Temp 98.1°F | Resp 17 | Ht 70.5 in | Wt 218.0 lb

## 2017-02-06 DIAGNOSIS — E7800 Pure hypercholesterolemia, unspecified: Secondary | ICD-10-CM

## 2017-02-06 DIAGNOSIS — F1020 Alcohol dependence, uncomplicated: Secondary | ICD-10-CM | POA: Diagnosis not present

## 2017-02-06 DIAGNOSIS — E034 Atrophy of thyroid (acquired): Secondary | ICD-10-CM | POA: Diagnosis not present

## 2017-02-06 DIAGNOSIS — L570 Actinic keratosis: Secondary | ICD-10-CM

## 2017-02-06 DIAGNOSIS — I10 Essential (primary) hypertension: Secondary | ICD-10-CM | POA: Diagnosis not present

## 2017-02-06 DIAGNOSIS — Z636 Dependent relative needing care at home: Secondary | ICD-10-CM

## 2017-02-06 DIAGNOSIS — R351 Nocturia: Secondary | ICD-10-CM

## 2017-02-06 DIAGNOSIS — N401 Enlarged prostate with lower urinary tract symptoms: Secondary | ICD-10-CM | POA: Diagnosis not present

## 2017-02-06 DIAGNOSIS — E669 Obesity, unspecified: Secondary | ICD-10-CM

## 2017-02-06 DIAGNOSIS — F329 Major depressive disorder, single episode, unspecified: Secondary | ICD-10-CM

## 2017-02-06 DIAGNOSIS — F419 Anxiety disorder, unspecified: Secondary | ICD-10-CM

## 2017-02-06 DIAGNOSIS — F32A Depression, unspecified: Secondary | ICD-10-CM

## 2017-02-06 DIAGNOSIS — M109 Gout, unspecified: Secondary | ICD-10-CM | POA: Diagnosis not present

## 2017-02-06 DIAGNOSIS — E785 Hyperlipidemia, unspecified: Secondary | ICD-10-CM

## 2017-02-06 DIAGNOSIS — J301 Allergic rhinitis due to pollen: Secondary | ICD-10-CM

## 2017-02-06 MED ORDER — CITALOPRAM HYDROBROMIDE 20 MG PO TABS: 20.0000 mg | ORAL_TABLET | Freq: Every day | ORAL | 1 refills | Status: DC

## 2017-02-06 MED ORDER — CITALOPRAM HYDROBROMIDE 20 MG PO TABS: 30.0000 mg | ORAL_TABLET | Freq: Every day | ORAL | 1 refills | Status: AC

## 2017-02-06 MED ORDER — LEVOTHYROXINE SODIUM 50 MCG PO TABS: 50.0000 ug | ORAL_TABLET | Freq: Every day | ORAL | 3 refills | Status: AC

## 2017-02-06 MED ORDER — LISINOPRIL-HYDROCHLOROTHIAZIDE 20-25 MG PO TABS
1.0000 | ORAL_TABLET | Freq: Every day | ORAL | 1 refills | Status: AC
Start: 2017-02-06 — End: ?

## 2017-02-06 MED ORDER — FLUTICASONE PROPIONATE 50 MCG/ACT NA SUSP: 2.0000 | Freq: Every day | NASAL | 11 refills | Status: AC

## 2017-02-06 MED ORDER — ALLOPURINOL 300 MG PO TABS: ORAL_TABLET | ORAL | 3 refills | Status: AC

## 2017-02-06 MED ORDER — PRAVASTATIN SODIUM 40 MG PO TABS: 40.0000 mg | ORAL_TABLET | Freq: Every day | ORAL | 3 refills | Status: AC

## 2017-02-06 NOTE — Progress Notes (Signed)
Subjective:    Patient ID: Clayton Kin., male    DOB: [birth year], 71 y.o.   MRN: [id number]  02/06/2017  Medication Refill (all on list ); Depression (6 month follow-up); Hypertension; and Hyperlipidemia   HPI This 71 y.o. male presents for six month follow-up hypertension, hypercholesterolemia, alcoholism.  Added Citalopram in 06/2016 for depression/anxiety.  Not checking blood pressure.  Two minor flares.    Wife has Alzheimer's dementia.  Admitted last week due to dehydration.  Married 46 years.  In process of buying new home closer to daughter's home.  Loves to get on pontoon.  Will continue to maintain home in [place].  Working full time.  Not sure how long will work. Working too much.  Working 55 hours per week.  Loves to work.    Not drinking; drinks a lot of coffee.   BP Readings from Last 3 Encounters:  02/06/17 121/74  07/20/16 133/92  07/01/16 120/80   Wt Readings from Last 3 Encounters:  02/06/17 218 lb (98.9 kg)  07/20/16 211 lb (95.7 kg)  07/01/16 211 lb 6.4 oz (95.9 kg)   Immunization History  Administered Date(s) Administered  . Influenza Split 05/22/2011, 05/28/2012  . Influenza,inj,Quad PF,36+ Mos 05/12/2013  . Influenza-Unspecified 04/20/2016  . Pneumococcal Conjugate-13 02/28/2015  . Pneumococcal Polysaccharide-23 04/09/2006, 07/01/2016  . Tdap 04/30/2009  . Zoster 07/21/2014     Review of Systems  Constitutional: Negative for activity change, appetite change, chills, diaphoresis, fatigue and fever.  HENT: Positive for congestion and postnasal drip. Negative for ear pain, rhinorrhea, sneezing and sore throat.   Respiratory: Positive for cough. Negative for shortness of breath.   Cardiovascular: Negative for chest pain, palpitations and leg swelling.  Gastrointestinal: Negative for abdominal pain, diarrhea, nausea and vomiting.  Endocrine: Negative for cold intolerance, heat intolerance, polydipsia, polyphagia and polyuria.  Skin: Negative for color  change, rash and wound.  Neurological: Negative for dizziness, tremors, seizures, syncope, facial asymmetry, speech difficulty, weakness, light-headedness, numbness and headaches.  Psychiatric/Behavioral: Positive for dysphoric mood. Negative for sleep disturbance. The patient is not nervous/anxious.     Past Medical History:  Diagnosis Date  . Alcoholism in recovery (HCC) 01/19/2015   [facility]  . Anxiety   . Arthritis   . BPH (benign prostatic hyperplasia)   . Cataract   . Gout   . Hyperlipidemia   . Hypertension    Past Surgical History:  Procedure Laterality Date  . ELBOW SURGERY    . EYE SURGERY Bilateral 2013   LENS IMPLANT  . HERNIA REPAIR  1954 and 1965  . KNEE SURGERY    . SHOULDER SURGERY    . VASECTOMY  1980   Allergies  Allergen Reactions  . Benadryl [Diphenhydramine Hcl] Anxiety    Social History   Social History  . Marital status: Married    Spouse name: N/A  . Number of children: N/A  . Years of education: N/A   Occupational History  . shipping    Social History Main Topics  . Smoking status: Former Smoker    Quit date: 11/09/1974  . Smokeless tobacco: Never Used  . Alcohol use 0.0 oz/week     Comment: denies current use  . Drug use: No  . Sexual activity: No   Other Topics Concern  . Not on file   Social History Narrative   Marital status: married x 44 years; happily married.      Children:  2 CHILDREN; 8 grandchildren; no gg.  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      Tobacco: quit 1976; smoked for 15 years      Alcohol:  Quit drinking in 08/2015; alcoholism; [facility] in 2017.      Exercise doing cardio and light weights; job is physical.  Previous marathon runner.      Seatbelt: 100%      Advanced Directives:  Yes; +living will; +FULL CODE but no prolonged measures.        Guns: unloaded and unsecured.      ADLs: independent with all ADLs.     Family History  Problem Relation Age of Onset  . Stroke  Mother        few mini strokes/TIAs  . Diabetes Mother   . Heart disease Father 6594       pacemaker  . Cerebral palsy Sister   . Cancer Sister        Breast cancer  . Hyperlipidemia Brother        Objective:    BP 121/74   Pulse (!) 57   Temp 98.1 F (36.7 C) (Oral)   Resp 17   Ht 5' 10.5" (1.791 m)   Wt 218 lb (98.9 kg)   SpO2 98%   BMI 30.84 kg/m  Physical Exam  Constitutional: He is oriented to person, place, and time. He appears well-developed and well-nourished. No distress.  HENT:  Head: Normocephalic and atraumatic.  Right Ear: External ear normal.  Left Ear: External ear normal.  Nose: Nose normal.  Mouth/Throat: Oropharynx is clear and moist.  Eyes: Pupils are equal, round, and reactive to light. Conjunctivae and EOM are normal.  Neck: Normal range of motion. Neck supple. Carotid bruit is not present. No thyromegaly present.  Cardiovascular: Normal rate, regular rhythm, normal heart sounds and intact distal pulses.  Exam reveals no gallop and no friction rub.   No murmur heard. Pulmonary/Chest: Effort normal and breath sounds normal. He has no wheezes. He has no rales.  Abdominal: Soft. Bowel sounds are normal. He exhibits no distension and no mass. There is no tenderness. There is no rebound and no guarding.  Lymphadenopathy:    He has no cervical adenopathy.  Neurological: He is alert and oriented to person, place, and time. No cranial nerve deficit.  Skin: Skin is warm and dry. No rash noted. He is not diaphoretic.  Scaling hypopigmented lesions B forearms and ears.  Psychiatric: He has a normal mood and affect. His behavior is normal.  Nursing note and vitals reviewed.  Depression screen [facility] 2/9 02/06/2017 07/01/2016 01/28/2016 09/25/2015 09/05/2015  Decreased Interest 2 0 0 0 0  Down, Depressed, Hopeless 2 0 0 0 0  PHQ - 2 Score 4 0 0 0 0  Altered sleeping 2 - - - -  Tired, decreased energy 2 - - - -  Change in appetite 2 - - - -  Feeling bad or failure  about yourself  0 - - - -  Trouble concentrating 0 - - - -  Moving slowly or fidgety/restless 0 - - - -  Suicidal thoughts 0 - - - -  PHQ-9 Score 10 - - - -  Difficult doing work/chores Somewhat difficult - - - -        Assessment & Plan:   1. Essential hypertension   2. Gouty arthropathy   3. Benign prostatic hyperplasia with nocturia   4. Anxiety and depression   5. Dyslipidemia   6. Obesity (BMI 30.0-34.9)  7. Hypothyroidism due to acquired atrophy of thyroid   8. Pure hypercholesterolemia   9. Seasonal allergic rhinitis due to pollen   10. Actinic keratoses    -controlled; obtain labs; refills provided. -increase Citalopram to 30mg  daily. -add Flonase for allergic rhinitis; if cough not improved with Flonase, switch to Losartan.  Pt declines GERD symptoms. -refer to dermatology due to numerous AK present.  Orders Placed This Encounter  Procedures  . CBC with Differential/Platelet  . Comprehensive metabolic panel    Order Specific Question:   Has the patient fasted?    Answer:   Yes  . Lipid panel    Order Specific Question:   Has the patient fasted?    Answer:   Yes  . TSH  . T4, free  . Uric acid  . Ambulatory referral to Dermatology    Referral Priority:   Routine    Referral Type:   Consultation    Referral Reason:   Specialty Services Required    Requested Specialty:   Dermatology    Number of Visits Requested:   1   Meds ordered this encounter  Medications  . fluticasone (FLONASE) 50 MCG/ACT nasal spray    Sig: Place 2 sprays into both nostrils daily.    Dispense:  16 g    Refill:  11  . allopurinol (ZYLOPRIM) 300 MG tablet    Sig: TAKE 1 TABLET(300 MG) BY MOUTH DAILY    Dispense:  90 tablet    Refill:  3  . DISCONTD: citalopram (CELEXA) 20 MG tablet    Sig: Take 1 tablet (20 mg total) by mouth daily.    Dispense:  90 tablet    Refill:  1  . lisinopril-hydrochlorothiazide (PRINZIDE,ZESTORETIC) 20-25 MG tablet    Sig: Take 1 tablet by mouth  daily.    Dispense:  90 tablet    Refill:  1    **Patient requests 90 days supply**  . pravastatin (PRAVACHOL) 40 MG tablet    Sig: Take 1 tablet (40 mg total) by mouth daily.    Dispense:  90 tablet    Refill:  3  . levothyroxine (SYNTHROID, LEVOTHROID) 50 MCG tablet    Sig: Take 1 tablet (50 mcg total) by mouth daily.    Dispense:  90 tablet    Refill:  3  . citalopram (CELEXA) 20 MG tablet    Sig: Take 1.5 tablets (30 mg total) by mouth daily.    Dispense:  135 tablet    Refill:  1    Return in about 6 months (around 08/09/2017) for complete physical examiniation.   Pearlene Teat Paulita Fujita, M.D. Primary Care at [facility] previously [facility] [address] [place], [place]  [address] [phone] phone [phone] fax

## 2017-02-06 NOTE — Patient Instructions (Addendum)
   IF you received an x-ray today, you will receive an invoice from [facility]. Please contact [facility] at [phone] with questions or concerns regarding your invoice.   IF you received labwork today, you will receive an invoice from [facility]. Please contact [facility] at [phone] with questions or concerns regarding your invoice.   Our billing staff will not be able to assist you with questions regarding bills from these companies.  You will be contacted with the lab results as soon as they are available. The fastest way to get your results is to activate your My Chart account. Instructions are located on the last page of this paperwork. If you have not heard from us regarding the results in 2 weeks, please contact this office.      Fat and Cholesterol Restricted Diet Getting too much fat and cholesterol in your diet may cause health problems. Following this diet helps keep your fat and cholesterol at normal levels. This can keep you from getting sick. What types of fat should I choose?  Choose monosaturated and polyunsaturated fats. These are found in foods such as olive oil, canola oil, flaxseeds, walnuts, almonds, and seeds.  Eat more omega-3 fats. Good choices include salmon, mackerel, sardines, tuna, flaxseed oil, and ground flaxseeds.  Limit saturated fats. These are in animal products such as meats, butter, and cream. They can also be in plant products such as palm oil, palm kernel oil, and coconut oil.  Avoid foods with partially hydrogenated oils in them. These contain trans fats. Examples of foods that have trans fats are stick margarine, some tub margarines, cookies, crackers, and other baked goods. What general guidelines do I need to follow?  Check food labels. Look for the words "trans fat" and "saturated fat."  When preparing a meal: ? Fill half of your plate with vegetables and green salads. ? Fill one fourth of your plate with whole  grains. Look for the word "whole" as the first word in the ingredient list. ? Fill one fourth of your plate with lean protein foods.  Eat more foods that have fiber, like apples, carrots, beans, peas, and barley.  Eat more home-cooked foods. Eat less at restaurants and buffets.  Limit or avoid alcohol.  Limit foods high in starch and sugar.  Limit fried foods.  Cook foods without frying them. Baking, boiling, grilling, and broiling are all great options.  Lose weight if you are overweight. Losing even a small amount of weight can help your overall health. It can also help prevent diseases such as diabetes and heart disease. What foods can I eat? Grains Whole grains, such as whole wheat or whole grain breads, crackers, cereals, and pasta. Unsweetened oatmeal, bulgur, barley, quinoa, or brown rice. Corn or whole wheat flour tortillas. Vegetables Fresh or frozen vegetables (raw, steamed, roasted, or grilled). Green salads. Fruits All fresh, canned (in natural juice), or frozen fruits. Meat and Other Protein Products Ground beef (85% or leaner), grass-fed beef, or beef trimmed of fat. Skinless chicken or turkey. Ground chicken or turkey. Pork trimmed of fat. All fish and seafood. Eggs. Dried beans, peas, or lentils. Unsalted nuts or seeds. Unsalted canned or dry beans. Dairy Low-fat dairy products, such as skim or 1% milk, 2% or reduced-fat cheeses, low-fat ricotta or cottage cheese, or plain low-fat yogurt. Fats and Oils Tub margarines without trans fats. Light or reduced-fat mayonnaise and salad dressings. Avocado. Olive, canola, sesame, or safflower oils. Natural peanut or almond butter (choose ones without   added sugar and oil). The items listed above may not be a complete list of recommended foods or beverages. Contact your dietitian for more options. What foods are not recommended? Grains White bread. White pasta. White rice. Cornbread. Bagels, pastries, and croissants. Crackers that  contain trans fat. Vegetables White potatoes. Corn. Creamed or fried vegetables. Vegetables in a cheese sauce. Fruits Dried fruits. Canned fruit in light or heavy syrup. Fruit juice. Meat and Other Protein Products Fatty cuts of meat. Ribs, chicken wings, bacon, sausage, bologna, salami, chitterlings, fatback, hot dogs, bratwurst, and packaged luncheon meats. Liver and organ meats. Dairy Whole or 2% milk, cream, half-and-half, and cream cheese. Whole milk cheeses. Whole-fat or sweetened yogurt. Full-fat cheeses. Nondairy creamers and whipped toppings. Processed cheese, cheese spreads, or cheese curds. Sweets and Desserts Corn syrup, sugars, honey, and molasses. Candy. Jam and jelly. Syrup. Sweetened cereals. Cookies, pies, cakes, donuts, muffins, and ice cream. Fats and Oils Butter, stick margarine, lard, shortening, ghee, or bacon fat. Coconut, palm kernel, or palm oils. Beverages Alcohol. Sweetened drinks (such as sodas, lemonade, and fruit drinks or punches). The items listed above may not be a complete list of foods and beverages to avoid. Contact your dietitian for more information. This information is not intended to replace advice given to you by your health care provider. Make sure you discuss any questions you have with your health care provider. Document Released: 01/06/2012 Document Revised: 03/13/2016 Document Reviewed: 10/06/2013 Elsevier Interactive Patient Education  2018 [employer]  

## 2017-02-07 LAB — COMPREHENSIVE METABOLIC PANEL
A/G RATIO: 1.7 (ref 1.2–2.2)
ALT: 31 IU/L (ref 0–44)
AST: 28 IU/L (ref 0–40)
Albumin: 4.1 g/dL (ref 3.6–4.8)
Alkaline Phosphatase: 72 IU/L (ref 39–117)
BUN/Creatinine Ratio: 16 (ref 10–24)
BUN: 15 mg/dL (ref 8–27)
Bilirubin Total: 0.4 mg/dL (ref 0.0–1.2)
CALCIUM: 9.2 mg/dL (ref 8.6–10.2)
CHLORIDE: 99 mmol/L (ref 96–106)
CO2: 25 mmol/L (ref 20–29)
Creatinine, Ser: 0.95 mg/dL (ref 0.76–1.27)
GFR calc Af Amer: 94 mL/min/{1.73_m2} (ref >59)
GFR, EST NON AFRICAN AMERICAN: 81 mL/min/{1.73_m2} (ref >59)
GLUCOSE: 90 mg/dL (ref 65–99)
Globulin, Total: 2.4 g/dL (ref 1.5–4.5)
POTASSIUM: 4.5 mmol/L (ref 3.5–5.2)
Sodium: 140 mmol/L (ref 134–144)
Total Protein: 6.5 g/dL (ref 6.0–8.5)

## 2017-02-07 LAB — LIPID PANEL
CHOL/HDL RATIO: 3.7 ratio (ref 0.0–5.0)
Cholesterol, Total: 155 mg/dL (ref 100–199)
HDL: 42 mg/dL (ref >39)
LDL Calculated: 84 mg/dL (ref 0–99)
Triglycerides: 145 mg/dL (ref 0–149)
VLDL CHOLESTEROL CAL: 29 mg/dL (ref 5–40)

## 2017-02-07 LAB — CBC WITH DIFFERENTIAL/PLATELET
BASOS ABS: 0 10*3/uL (ref 0.0–0.2)
BASOS: 1 %
EOS (ABSOLUTE): 0.2 10*3/uL (ref 0.0–0.4)
Eos: 3 %
Hematocrit: 41.1 % (ref 37.5–51.0)
Hemoglobin: 13.7 g/dL (ref 13.0–17.7)
IMMATURE GRANULOCYTES: 0 %
Immature Grans (Abs): 0 10*3/uL (ref 0.0–0.1)
Lymphocytes Absolute: 1.5 10*3/uL (ref 0.7–3.1)
Lymphs: 25 %
MCH: 30.3 pg (ref 26.6–33.0)
MCHC: 33.3 g/dL (ref 31.5–35.7)
MCV: 91 fL (ref 79–97)
Monocytes Absolute: 0.4 10*3/uL (ref 0.1–0.9)
Monocytes: 6 %
Neutrophils Absolute: 4.1 10*3/uL (ref 1.4–7.0)
Neutrophils: 65 %
PLATELETS: 246 10*3/uL (ref 150–379)
RBC: 4.52 x10E6/uL (ref 4.14–5.80)
RDW: 13.6 % (ref 12.3–15.4)
WBC: 6.2 10*3/uL (ref 3.4–10.8)

## 2017-02-07 LAB — TSH: TSH: 1.88 u[IU]/mL (ref 0.450–4.500)

## 2017-02-07 LAB — URIC ACID: Uric Acid: 5.9 mg/dL (ref 3.7–8.6)

## 2017-02-07 LAB — T4, FREE: Free T4: 1.09 ng/dL (ref 0.82–1.77)

## 2017-05-13 DIAGNOSIS — M2550 Pain in unspecified joint: Secondary | ICD-10-CM | POA: Diagnosis not present

## 2017-05-13 DIAGNOSIS — M064 Inflammatory polyarthropathy: Secondary | ICD-10-CM | POA: Diagnosis not present

## 2017-05-13 DIAGNOSIS — Z6831 Body mass index (BMI) 31.0-31.9, adult: Secondary | ICD-10-CM | POA: Diagnosis not present

## 2017-05-13 DIAGNOSIS — M150 Primary generalized (osteo)arthritis: Secondary | ICD-10-CM | POA: Diagnosis not present

## 2017-05-13 DIAGNOSIS — M1009 Idiopathic gout, multiple sites: Secondary | ICD-10-CM | POA: Diagnosis not present

## 2017-06-18 DIAGNOSIS — F1021 Alcohol dependence, in remission: Secondary | ICD-10-CM | POA: Diagnosis not present

## 2017-06-18 DIAGNOSIS — E038 Other specified hypothyroidism: Secondary | ICD-10-CM | POA: Diagnosis not present

## 2017-06-18 DIAGNOSIS — D126 Benign neoplasm of colon, unspecified: Secondary | ICD-10-CM | POA: Diagnosis not present

## 2017-06-18 DIAGNOSIS — N401 Enlarged prostate with lower urinary tract symptoms: Secondary | ICD-10-CM | POA: Diagnosis not present

## 2017-07-20 DIAGNOSIS — I10 Essential (primary) hypertension: Secondary | ICD-10-CM | POA: Diagnosis not present

## 2017-07-20 DIAGNOSIS — M109 Gout, unspecified: Secondary | ICD-10-CM | POA: Diagnosis not present

## 2017-07-20 DIAGNOSIS — E7800 Pure hypercholesterolemia, unspecified: Secondary | ICD-10-CM | POA: Diagnosis not present

## 2017-07-20 DIAGNOSIS — R82998 Other abnormal findings in urine: Secondary | ICD-10-CM | POA: Diagnosis not present

## 2017-07-20 DIAGNOSIS — E038 Other specified hypothyroidism: Secondary | ICD-10-CM | POA: Diagnosis not present

## 2017-07-24 DIAGNOSIS — Z0000 Encounter for general adult medical examination without abnormal findings: Secondary | ICD-10-CM | POA: Diagnosis not present

## 2017-07-24 DIAGNOSIS — D126 Benign neoplasm of colon, unspecified: Secondary | ICD-10-CM | POA: Diagnosis not present

## 2017-07-24 DIAGNOSIS — F1021 Alcohol dependence, in remission: Secondary | ICD-10-CM | POA: Diagnosis not present

## 2017-07-24 DIAGNOSIS — E038 Other specified hypothyroidism: Secondary | ICD-10-CM | POA: Diagnosis not present

## 2017-08-03 DIAGNOSIS — Z1212 Encounter for screening for malignant neoplasm of rectum: Secondary | ICD-10-CM | POA: Diagnosis not present

## 2017-08-11 ENCOUNTER — Encounter: Payer: Medicare Other | Admitting: Family Medicine

## 2017-10-06 DIAGNOSIS — M2550 Pain in unspecified joint: Secondary | ICD-10-CM | POA: Diagnosis not present

## 2017-10-06 DIAGNOSIS — M545 Low back pain: Secondary | ICD-10-CM | POA: Diagnosis not present

## 2017-10-06 DIAGNOSIS — M1009 Idiopathic gout, multiple sites: Secondary | ICD-10-CM | POA: Diagnosis not present

## 2017-10-06 DIAGNOSIS — M150 Primary generalized (osteo)arthritis: Secondary | ICD-10-CM | POA: Diagnosis not present

## 2017-11-04 DIAGNOSIS — H52223 Regular astigmatism, bilateral: Secondary | ICD-10-CM | POA: Diagnosis not present

## 2017-12-15 ENCOUNTER — Encounter: Payer: Self-pay | Admitting: Family Medicine

## 2018-01-14 DIAGNOSIS — Z6832 Body mass index (BMI) 32.0-32.9, adult: Secondary | ICD-10-CM | POA: Diagnosis not present

## 2018-01-14 DIAGNOSIS — E669 Obesity, unspecified: Secondary | ICD-10-CM | POA: Diagnosis not present

## 2018-01-14 DIAGNOSIS — M064 Inflammatory polyarthropathy: Secondary | ICD-10-CM | POA: Diagnosis not present

## 2018-01-14 DIAGNOSIS — M1009 Idiopathic gout, multiple sites: Secondary | ICD-10-CM | POA: Diagnosis not present

## 2018-01-19 DIAGNOSIS — M064 Inflammatory polyarthropathy: Secondary | ICD-10-CM | POA: Diagnosis not present

## 2018-01-19 DIAGNOSIS — M1990 Unspecified osteoarthritis, unspecified site: Secondary | ICD-10-CM | POA: Diagnosis not present

## 2018-01-19 DIAGNOSIS — Z79899 Other long term (current) drug therapy: Secondary | ICD-10-CM | POA: Diagnosis not present

## 2018-01-19 DIAGNOSIS — E038 Other specified hypothyroidism: Secondary | ICD-10-CM | POA: Diagnosis not present

## 2018-04-13 DIAGNOSIS — M2550 Pain in unspecified joint: Secondary | ICD-10-CM | POA: Diagnosis not present

## 2018-04-13 DIAGNOSIS — M1009 Idiopathic gout, multiple sites: Secondary | ICD-10-CM | POA: Diagnosis not present

## 2018-04-13 DIAGNOSIS — M064 Inflammatory polyarthropathy: Secondary | ICD-10-CM | POA: Diagnosis not present

## 2018-04-13 DIAGNOSIS — M150 Primary generalized (osteo)arthritis: Secondary | ICD-10-CM | POA: Diagnosis not present

## 2018-05-11 DIAGNOSIS — W19XXXA Unspecified fall, initial encounter: Secondary | ICD-10-CM | POA: Diagnosis not present

## 2018-05-11 DIAGNOSIS — M25511 Pain in right shoulder: Secondary | ICD-10-CM | POA: Diagnosis not present

## 2018-06-10 DIAGNOSIS — M1009 Idiopathic gout, multiple sites: Secondary | ICD-10-CM | POA: Diagnosis not present

## 2018-06-10 DIAGNOSIS — M064 Inflammatory polyarthropathy: Secondary | ICD-10-CM | POA: Diagnosis not present

## 2018-06-10 DIAGNOSIS — M150 Primary generalized (osteo)arthritis: Secondary | ICD-10-CM | POA: Diagnosis not present

## 2018-06-10 DIAGNOSIS — M2550 Pain in unspecified joint: Secondary | ICD-10-CM | POA: Diagnosis not present

## 2018-07-13 DIAGNOSIS — M064 Inflammatory polyarthropathy: Secondary | ICD-10-CM | POA: Diagnosis not present

## 2018-07-27 DIAGNOSIS — M109 Gout, unspecified: Secondary | ICD-10-CM | POA: Diagnosis not present

## 2018-07-27 DIAGNOSIS — I10 Essential (primary) hypertension: Secondary | ICD-10-CM | POA: Diagnosis not present

## 2018-07-27 DIAGNOSIS — E7800 Pure hypercholesterolemia, unspecified: Secondary | ICD-10-CM | POA: Diagnosis not present

## 2018-07-27 DIAGNOSIS — E039 Hypothyroidism, unspecified: Secondary | ICD-10-CM | POA: Diagnosis not present

## 2018-07-27 DIAGNOSIS — E038 Other specified hypothyroidism: Secondary | ICD-10-CM | POA: Diagnosis not present

## 2018-07-30 DIAGNOSIS — I10 Essential (primary) hypertension: Secondary | ICD-10-CM | POA: Diagnosis not present

## 2018-07-30 DIAGNOSIS — M109 Gout, unspecified: Secondary | ICD-10-CM | POA: Diagnosis not present

## 2018-07-30 DIAGNOSIS — Z0000 Encounter for general adult medical examination without abnormal findings: Secondary | ICD-10-CM | POA: Diagnosis not present

## 2018-07-30 DIAGNOSIS — E7800 Pure hypercholesterolemia, unspecified: Secondary | ICD-10-CM | POA: Diagnosis not present

## 2018-09-16 DIAGNOSIS — Z6833 Body mass index (BMI) 33.0-33.9, adult: Secondary | ICD-10-CM | POA: Diagnosis not present

## 2018-09-16 DIAGNOSIS — M1009 Idiopathic gout, multiple sites: Secondary | ICD-10-CM | POA: Diagnosis not present

## 2018-09-16 DIAGNOSIS — E669 Obesity, unspecified: Secondary | ICD-10-CM | POA: Diagnosis not present

## 2018-09-16 DIAGNOSIS — M064 Inflammatory polyarthropathy: Secondary | ICD-10-CM | POA: Diagnosis not present

## 2018-10-26 DIAGNOSIS — M064 Inflammatory polyarthropathy: Secondary | ICD-10-CM | POA: Diagnosis not present

## 2018-12-28 DIAGNOSIS — M1009 Idiopathic gout, multiple sites: Secondary | ICD-10-CM | POA: Diagnosis not present

## 2018-12-28 DIAGNOSIS — H35373 Puckering of macula, bilateral: Secondary | ICD-10-CM | POA: Diagnosis not present

## 2018-12-28 DIAGNOSIS — Z79899 Other long term (current) drug therapy: Secondary | ICD-10-CM | POA: Diagnosis not present

## 2018-12-28 DIAGNOSIS — Z961 Presence of intraocular lens: Secondary | ICD-10-CM | POA: Diagnosis not present

## 2018-12-28 DIAGNOSIS — H43813 Vitreous degeneration, bilateral: Secondary | ICD-10-CM | POA: Diagnosis not present

## 2019-01-18 DIAGNOSIS — M150 Primary generalized (osteo)arthritis: Secondary | ICD-10-CM | POA: Diagnosis not present

## 2019-01-18 DIAGNOSIS — M2550 Pain in unspecified joint: Secondary | ICD-10-CM | POA: Diagnosis not present

## 2019-01-18 DIAGNOSIS — M1009 Idiopathic gout, multiple sites: Secondary | ICD-10-CM | POA: Diagnosis not present

## 2019-01-18 DIAGNOSIS — M064 Inflammatory polyarthropathy: Secondary | ICD-10-CM | POA: Diagnosis not present

## 2019-02-28 DIAGNOSIS — Z79899 Other long term (current) drug therapy: Secondary | ICD-10-CM | POA: Diagnosis not present

## 2019-03-07 DIAGNOSIS — M545 Low back pain: Secondary | ICD-10-CM | POA: Diagnosis not present

## 2019-03-21 DIAGNOSIS — M064 Inflammatory polyarthropathy: Secondary | ICD-10-CM | POA: Diagnosis not present

## 2019-03-21 DIAGNOSIS — M1009 Idiopathic gout, multiple sites: Secondary | ICD-10-CM | POA: Diagnosis not present

## 2019-03-21 DIAGNOSIS — M150 Primary generalized (osteo)arthritis: Secondary | ICD-10-CM | POA: Diagnosis not present

## 2019-03-21 DIAGNOSIS — M2550 Pain in unspecified joint: Secondary | ICD-10-CM | POA: Diagnosis not present

## 2019-03-31 DIAGNOSIS — E039 Hypothyroidism, unspecified: Secondary | ICD-10-CM | POA: Diagnosis not present

## 2019-03-31 DIAGNOSIS — E785 Hyperlipidemia, unspecified: Secondary | ICD-10-CM | POA: Diagnosis not present

## 2019-03-31 DIAGNOSIS — I10 Essential (primary) hypertension: Secondary | ICD-10-CM | POA: Diagnosis not present

## 2019-03-31 DIAGNOSIS — M109 Gout, unspecified: Secondary | ICD-10-CM | POA: Diagnosis not present

## 2019-06-22 DIAGNOSIS — M2550 Pain in unspecified joint: Secondary | ICD-10-CM | POA: Diagnosis not present

## 2019-06-22 DIAGNOSIS — M064 Inflammatory polyarthropathy: Secondary | ICD-10-CM | POA: Diagnosis not present

## 2019-06-22 DIAGNOSIS — M1009 Idiopathic gout, multiple sites: Secondary | ICD-10-CM | POA: Diagnosis not present

## 2019-06-22 DIAGNOSIS — Z23 Encounter for immunization: Secondary | ICD-10-CM | POA: Diagnosis not present

## 2019-09-27 DIAGNOSIS — M436 Torticollis: Secondary | ICD-10-CM | POA: Diagnosis not present

## 2019-10-26 DIAGNOSIS — M150 Primary generalized (osteo)arthritis: Secondary | ICD-10-CM | POA: Diagnosis not present

## 2019-10-26 DIAGNOSIS — M2550 Pain in unspecified joint: Secondary | ICD-10-CM | POA: Diagnosis not present

## 2019-10-26 DIAGNOSIS — M064 Inflammatory polyarthropathy: Secondary | ICD-10-CM | POA: Diagnosis not present

## 2019-10-26 DIAGNOSIS — M1009 Idiopathic gout, multiple sites: Secondary | ICD-10-CM | POA: Diagnosis not present

## 2019-11-18 DIAGNOSIS — S300XXA Contusion of lower back and pelvis, initial encounter: Secondary | ICD-10-CM | POA: Diagnosis not present

## 2019-11-18 DIAGNOSIS — S59901A Unspecified injury of right elbow, initial encounter: Secondary | ICD-10-CM | POA: Diagnosis not present

## 2019-11-19 DIAGNOSIS — M25521 Pain in right elbow: Secondary | ICD-10-CM | POA: Diagnosis not present

## 2019-11-19 DIAGNOSIS — L03113 Cellulitis of right upper limb: Secondary | ICD-10-CM | POA: Diagnosis not present

## 2020-02-01 DIAGNOSIS — L280 Lichen simplex chronicus: Secondary | ICD-10-CM | POA: Diagnosis not present

## 2020-02-01 DIAGNOSIS — C44529 Squamous cell carcinoma of skin of other part of trunk: Secondary | ICD-10-CM | POA: Diagnosis not present

## 2020-02-01 DIAGNOSIS — C44622 Squamous cell carcinoma of skin of right upper limb, including shoulder: Secondary | ICD-10-CM | POA: Diagnosis not present

## 2020-02-01 DIAGNOSIS — C44229 Squamous cell carcinoma of skin of left ear and external auricular canal: Secondary | ICD-10-CM | POA: Diagnosis not present

## 2020-02-01 DIAGNOSIS — L578 Other skin changes due to chronic exposure to nonionizing radiation: Secondary | ICD-10-CM | POA: Diagnosis not present

## 2020-02-29 DIAGNOSIS — H35373 Puckering of macula, bilateral: Secondary | ICD-10-CM | POA: Diagnosis not present

## 2020-02-29 DIAGNOSIS — Z79899 Other long term (current) drug therapy: Secondary | ICD-10-CM | POA: Diagnosis not present

## 2020-02-29 DIAGNOSIS — H43813 Vitreous degeneration, bilateral: Secondary | ICD-10-CM | POA: Diagnosis not present

## 2020-02-29 DIAGNOSIS — H353132 Nonexudative age-related macular degeneration, bilateral, intermediate dry stage: Secondary | ICD-10-CM | POA: Diagnosis not present

## 2020-03-23 DIAGNOSIS — M2550 Pain in unspecified joint: Secondary | ICD-10-CM | POA: Diagnosis not present

## 2020-03-23 DIAGNOSIS — M064 Inflammatory polyarthropathy: Secondary | ICD-10-CM | POA: Diagnosis not present

## 2020-03-23 DIAGNOSIS — M150 Primary generalized (osteo)arthritis: Secondary | ICD-10-CM | POA: Diagnosis not present

## 2020-03-23 DIAGNOSIS — M1009 Idiopathic gout, multiple sites: Secondary | ICD-10-CM | POA: Diagnosis not present

## 2020-07-06 DIAGNOSIS — M1009 Idiopathic gout, multiple sites: Secondary | ICD-10-CM | POA: Diagnosis not present

## 2020-07-06 DIAGNOSIS — Z79899 Other long term (current) drug therapy: Secondary | ICD-10-CM | POA: Diagnosis not present

## 2024-02-19 DEATH — deceased
